# Patient Record
Sex: Female | Born: 1955 | Race: Black or African American | Hispanic: No
Health system: Southern US, Community
[De-identification: ages and names within clinical notes are randomized; demographics above are authoritative.]

## PROBLEM LIST (undated history)

## (undated) DIAGNOSIS — R42 Dizziness and giddiness: Secondary | ICD-10-CM

## (undated) DIAGNOSIS — I10 Essential (primary) hypertension: Secondary | ICD-10-CM

## (undated) DIAGNOSIS — E119 Type 2 diabetes mellitus without complications: Secondary | ICD-10-CM

## (undated) HISTORY — PX: TONSILLECTOMY: SHX5217

## (undated) HISTORY — PX: BREAST LUMPECTOMY: SHX2

## (undated) HISTORY — PX: BREAST CYST EXCISION: SHX579

---

## 2012-03-23 LAB — COMPREHENSIVE METABOLIC PANEL
Bilirubin,Total: 0.3 mg/dL (ref 0.2–1.0)
Co2: 26 mmol/L (ref 21–32)
EGFR (African American): >60
EGFR (Non-African Amer.): 60 — ABNORMAL LOW
Glucose: 167 mg/dL — ABNORMAL HIGH (ref 65–99)
Potassium: 3 mmol/L — ABNORMAL LOW (ref 3.5–5.1)
SGOT(AST): 17 U/L (ref 15–37)
SGPT (ALT): 20 U/L (ref 12–78)
Sodium: 139 mmol/L (ref 136–145)
Total Protein: 8.5 g/dL — ABNORMAL HIGH (ref 6.4–8.2)

## 2012-03-23 LAB — PRO B NATRIURETIC PEPTIDE: B-Type Natriuretic Peptide: 86 pg/mL (ref 0–125)

## 2012-03-23 LAB — URINALYSIS, COMPLETE
Bilirubin,UR: NEGATIVE
Ketone: NEGATIVE
Nitrite: NEGATIVE
Ph: 7 (ref 4.5–8.0)
Protein: 100
RBC,UR: 7 /HPF (ref 0–5)
Specific Gravity: 1.009 (ref 1.003–1.030)
Squamous Epithelial: 9
WBC UR: 7 /HPF (ref 0–5)

## 2012-03-23 LAB — CBC
HCT: 37.3 % (ref 35.0–47.0)
HGB: 11.8 g/dL — ABNORMAL LOW (ref 12.0–16.0)
Platelet: 463 10*3/uL — ABNORMAL HIGH (ref 150–440)

## 2012-03-23 LAB — CK TOTAL AND CKMB (NOT AT ARMC): CK, Total: 117 U/L (ref 21–215)

## 2012-03-23 LAB — TSH: Thyroid Stimulating Horm: 1.18 u[IU]/mL

## 2012-03-24 ENCOUNTER — Inpatient Hospital Stay: Payer: Self-pay | Admitting: Internal Medicine

## 2012-03-24 LAB — BASIC METABOLIC PANEL
Anion Gap: 6 — ABNORMAL LOW (ref 7–16)
BUN: 14 mg/dL (ref 7–18)
Calcium, Total: 9 mg/dL (ref 8.5–10.1)
Co2: 28 mmol/L (ref 21–32)
Creatinine: 0.91 mg/dL (ref 0.60–1.30)
EGFR (African American): >60
EGFR (Non-African Amer.): >60
Glucose: 145 mg/dL — ABNORMAL HIGH (ref 65–99)
Osmolality: 281 (ref 275–301)
Potassium: 4.3 mmol/L (ref 3.5–5.1)
Sodium: 139 mmol/L (ref 136–145)

## 2012-03-24 LAB — MAGNESIUM: Magnesium: 2.1 mg/dL

## 2012-03-24 LAB — LIPID PANEL
Cholesterol: 198 mg/dL (ref 0–200)
Ldl Cholesterol, Calc: 132 mg/dL — ABNORMAL HIGH (ref 0–100)

## 2012-03-24 LAB — CK TOTAL AND CKMB (NOT AT ARMC)
CK, Total: 110 U/L (ref 21–215)
CK, Total: 115 U/L (ref 21–215)
CK-MB: 0.8 ng/mL (ref 0.5–3.6)
CK-MB: 1.1 ng/mL (ref 0.5–3.6)

## 2012-03-24 LAB — HEMOGLOBIN A1C: Hemoglobin A1C: 6.2 % (ref 4.2–6.3)

## 2012-03-24 LAB — CBC WITH DIFFERENTIAL/PLATELET
Basophil %: 1 %
Eosinophil %: 0.3 %
HGB: 12.4 g/dL (ref 12.0–16.0)
Lymphocyte #: 2.8 10*3/uL (ref 1.0–3.6)
Lymphocyte %: 28.3 %
MCH: 27.3 pg (ref 26.0–34.0)
MCHC: 34.6 g/dL (ref 32.0–36.0)
MCV: 79 fL — ABNORMAL LOW (ref 80–100)
Neutrophil %: 66.1 %
Platelet: 430 10*3/uL (ref 150–440)
RBC: 4.56 10*6/uL (ref 3.80–5.20)
RDW: 16.3 % — ABNORMAL HIGH (ref 11.5–14.5)
WBC: 9.9 10*3/uL (ref 3.6–11.0)

## 2012-03-24 LAB — TROPONIN I: Troponin-I: 0.15 ng/mL — ABNORMAL HIGH

## 2012-03-25 DIAGNOSIS — R079 Chest pain, unspecified: Secondary | ICD-10-CM

## 2012-03-25 LAB — URINE CULTURE

## 2013-04-12 ENCOUNTER — Emergency Department: Payer: Self-pay | Admitting: Emergency Medicine

## 2013-04-12 LAB — BASIC METABOLIC PANEL
ANION GAP: 4 — AB (ref 7–16)
BUN: 18 mg/dL (ref 7–18)
CREATININE: 0.99 mg/dL (ref 0.60–1.30)
Calcium, Total: 8.8 mg/dL (ref 8.5–10.1)
Chloride: 103 mmol/L (ref 98–107)
Co2: 28 mmol/L (ref 21–32)
EGFR (Non-African Amer.): >60
GLUCOSE: 151 mg/dL — AB (ref 65–99)
OSMOLALITY: 275 (ref 275–301)
Potassium: 3.2 mmol/L — ABNORMAL LOW (ref 3.5–5.1)
SODIUM: 135 mmol/L — AB (ref 136–145)

## 2013-04-12 LAB — CBC WITH DIFFERENTIAL/PLATELET
BASOS ABS: 0.1 10*3/uL (ref 0.0–0.1)
Basophil %: 0.9 %
Eosinophil #: 0.4 10*3/uL (ref 0.0–0.7)
Eosinophil %: 4.6 %
HCT: 37 % (ref 35.0–47.0)
HGB: 11.8 g/dL — ABNORMAL LOW (ref 12.0–16.0)
LYMPHS ABS: 3.2 10*3/uL (ref 1.0–3.6)
Lymphocyte %: 36 %
MCH: 27.6 pg (ref 26.0–34.0)
MCHC: 32 g/dL (ref 32.0–36.0)
MCV: 86 fL (ref 80–100)
Monocyte #: 0.6 x10 3/mm (ref 0.2–0.9)
Monocyte %: 7.3 %
NEUTROS PCT: 51.2 %
Neutrophil #: 4.5 10*3/uL (ref 1.4–6.5)
Platelet: 334 10*3/uL (ref 150–440)
RBC: 4.29 10*6/uL (ref 3.80–5.20)
RDW: 14.8 % — AB (ref 11.5–14.5)
WBC: 8.7 10*3/uL (ref 3.6–11.0)

## 2013-04-12 LAB — TROPONIN I: Troponin-I: 0.03 ng/mL

## 2013-04-13 LAB — URINALYSIS, COMPLETE
BILIRUBIN, UR: NEGATIVE
BLOOD: NEGATIVE
GLUCOSE, UR: NEGATIVE mg/dL (ref 0–75)
Ketone: NEGATIVE
NITRITE: NEGATIVE
Ph: 5 (ref 4.5–8.0)
Protein: NEGATIVE
RBC,UR: 10 /HPF (ref 0–5)
SPECIFIC GRAVITY: 1.024 (ref 1.003–1.030)
Squamous Epithelial: 28
WBC UR: 27 /HPF (ref 0–5)

## 2013-09-15 ENCOUNTER — Encounter (HOSPITAL_COMMUNITY): Payer: Self-pay | Admitting: Emergency Medicine

## 2013-09-15 ENCOUNTER — Emergency Department (HOSPITAL_COMMUNITY)
Admission: EM | Admit: 2013-09-15 | Discharge: 2013-09-15 | Disposition: A | Discharge status: Home / Self Care | Payer: Self-pay | Attending: Emergency Medicine | Admitting: Emergency Medicine

## 2013-09-15 DIAGNOSIS — I10 Essential (primary) hypertension: Secondary | ICD-10-CM | POA: Insufficient documentation

## 2013-09-15 DIAGNOSIS — R42 Dizziness and giddiness: Secondary | ICD-10-CM | POA: Insufficient documentation

## 2013-09-15 DIAGNOSIS — H9190 Unspecified hearing loss, unspecified ear: Secondary | ICD-10-CM | POA: Insufficient documentation

## 2013-09-15 DIAGNOSIS — E119 Type 2 diabetes mellitus without complications: Secondary | ICD-10-CM | POA: Insufficient documentation

## 2013-09-15 DIAGNOSIS — Z79899 Other long term (current) drug therapy: Secondary | ICD-10-CM | POA: Insufficient documentation

## 2013-09-15 DIAGNOSIS — R110 Nausea: Secondary | ICD-10-CM | POA: Insufficient documentation

## 2013-09-15 DIAGNOSIS — Z7982 Long term (current) use of aspirin: Secondary | ICD-10-CM | POA: Insufficient documentation

## 2013-09-15 HISTORY — DX: Essential (primary) hypertension: I10

## 2013-09-15 HISTORY — DX: Type 2 diabetes mellitus without complications: E11.9

## 2013-09-15 HISTORY — DX: Dizziness and giddiness: R42

## 2013-09-15 LAB — BASIC METABOLIC PANEL
Anion gap: 16 — ABNORMAL HIGH (ref 5–15)
BUN: 19 mg/dL (ref 6–23)
CALCIUM: 9.5 mg/dL (ref 8.4–10.5)
CO2: 25 mEq/L (ref 19–32)
Chloride: 98 mEq/L (ref 96–112)
Creatinine, Ser: 0.91 mg/dL (ref 0.50–1.10)
GFR calc Af Amer: 80 mL/min — ABNORMAL LOW (ref >90)
GFR calc non Af Amer: 69 mL/min — ABNORMAL LOW (ref >90)
GLUCOSE: 166 mg/dL — AB (ref 70–99)
POTASSIUM: 3.1 meq/L — AB (ref 3.7–5.3)
Sodium: 139 mEq/L (ref 137–147)

## 2013-09-15 LAB — CBC WITH DIFFERENTIAL/PLATELET
Basophils Absolute: 0 10*3/uL (ref 0.0–0.1)
Basophils Relative: 0 % (ref 0–1)
Eosinophils Absolute: 0.1 10*3/uL (ref 0.0–0.7)
Eosinophils Relative: 1 % (ref 0–5)
HEMATOCRIT: 39.7 % (ref 36.0–46.0)
HEMOGLOBIN: 12.9 g/dL (ref 12.0–15.0)
Lymphocytes Relative: 19 % (ref 12–46)
Lymphs Abs: 1.9 10*3/uL (ref 0.7–4.0)
MCH: 28.2 pg (ref 26.0–34.0)
MCHC: 32.5 g/dL (ref 30.0–36.0)
MCV: 86.9 fL (ref 78.0–100.0)
MONO ABS: 0.4 10*3/uL (ref 0.1–1.0)
MONOS PCT: 4 % (ref 3–12)
NEUTROS ABS: 7.5 10*3/uL (ref 1.7–7.7)
NEUTROS PCT: 76 % (ref 43–77)
Platelets: 403 10*3/uL — ABNORMAL HIGH (ref 150–400)
RBC: 4.57 MIL/uL (ref 3.87–5.11)
RDW: 14.1 % (ref 11.5–15.5)
WBC: 9.9 10*3/uL (ref 4.0–10.5)

## 2013-09-15 LAB — URINALYSIS, ROUTINE W REFLEX MICROSCOPIC
Bilirubin Urine: NEGATIVE
GLUCOSE, UA: NEGATIVE mg/dL
KETONES UR: 15 mg/dL — AB
Nitrite: POSITIVE — AB
Protein, ur: NEGATIVE mg/dL
Specific Gravity, Urine: 1.018 (ref 1.005–1.030)
UROBILINOGEN UA: 0.2 mg/dL (ref 0.0–1.0)
pH: 6.5 (ref 5.0–8.0)

## 2013-09-15 LAB — URINE MICROSCOPIC-ADD ON

## 2013-09-15 LAB — CBG MONITORING, ED: GLUCOSE-CAPILLARY: 165 mg/dL — AB (ref 70–99)

## 2013-09-15 MED ORDER — POTASSIUM CHLORIDE CRYS ER 20 MEQ PO TBCR: 20.0000 meq | EXTENDED_RELEASE_TABLET | Freq: Every day | ORAL | Status: AC

## 2013-09-15 MED ORDER — POTASSIUM CHLORIDE CRYS ER 20 MEQ PO TBCR
40.0000 meq | EXTENDED_RELEASE_TABLET | Freq: Once | ORAL | Status: AC
  Administered 2013-09-15: 40 meq via ORAL
  Filled 2013-09-15: qty 2

## 2013-09-15 NOTE — Discharge Instructions (Signed)
Benign Positional Vertigo Your blood potassium today was 3.1, slightly low. Your blood sugar today was 166, slightly high. Keep your scheduled appointment with the [facility] [facility] next week. Asked him to recheck your blood potassium. Take these instructions with you. Take the meclizine (antivert) as needed for dizziness Vertigo means you feel like you or your surroundings are moving when they are not. Benign positional vertigo is the most common form of vertigo. Benign means that the cause of your condition is not serious. Benign positional vertigo is more common in older adults. CAUSES  Benign positional vertigo is the result of an upset in the labyrinth system. This is an area in the middle ear that helps control your balance. This may be caused by a viral infection, head injury, or repetitive motion. However, often no specific cause is found. SYMPTOMS  Symptoms of benign positional vertigo occur when you move your head or eyes in different directions. Some of the symptoms may include:  Loss of balance and falls.  Vomiting.  Blurred vision.  Dizziness.  Nausea.  Involuntary eye movements (nystagmus). DIAGNOSIS  Benign positional vertigo is usually diagnosed by physical exam. If the specific cause of your benign positional vertigo is unknown, your caregiver may perform imaging tests, such as magnetic resonance imaging (MRI) or computed tomography (CT). TREATMENT  Your caregiver may recommend movements or procedures to correct the benign positional vertigo. Medicines such as meclizine, benzodiazepines, and medicines for nausea may be used to treat your symptoms. In rare cases, if your symptoms are caused by certain conditions that affect the inner ear, you may need surgery. HOME CARE INSTRUCTIONS   Follow your caregiver's instructions.  Move slowly. Do not make sudden body or head movements.  Avoid driving.  Avoid operating heavy machinery.  Avoid performing any tasks that  would be dangerous to you or others during a vertigo episode.  Drink enough fluids to keep your urine clear or pale yellow. SEEK IMMEDIATE MEDICAL CARE IF:   You develop problems with walking, weakness, numbness, or using your arms, hands, or legs.  You have difficulty speaking.  You develop severe headaches.  Your nausea or vomiting continues or gets worse.  You develop visual changes.  Your family or friends notice any behavioral changes.  Your condition gets worse.  You have a fever.  You develop a stiff neck or sensitivity to light. MAKE SURE YOU:   Understand these instructions.  Will watch your condition.  Will get help right away if you are not doing well or get worse. Document Released: 10/27/2005 Document Revised: 04/13/2011 Document Reviewed: 10/09/2010 [facility] Patient Information 2015 [place], [place]. This information is not intended to replace advice given to you by your health care provider. Make sure you discuss any questions you have with your health care provider.

## 2013-09-15 NOTE — ED Notes (Addendum)
[facility] arrives alert, NAD, calm, interactive, resps e/u, speaking in clear complete sentences,  No dyspnea noted, skin W&D. NSL in place L hand. C/o "vertigo" dizziness and nausea. H/o vertigo, last episode in March. Took meclizine PTA w/o benefit. "feels much better now than before", (Denies: pain, HA, visual changes, nausea, sob or other sx), sx onset ~1800, reports "new recent dx of DM no meds, does not check sugars, diet controlled and education for now", previous care at [facility], last ate "this morning".

## 2013-09-15 NOTE — ED Provider Notes (Signed)
CSN: [id number]     Arrival date & time 09/15/13  2056 History   None    Chief Complaint  Patient presents with  . Dizziness     (Consider location/radiation/quality/duration/timing/severity/associated sxs/prior Treatment) HPI ComPlains of dizziness typical of vertigo onset 8 PM tonight while at work symptoms worse with changing positions or moving her head improved with remaining still. Associated symptoms include nausea. She denies visual changes denies difficulty speaking no focal numbness or weakness. Treated herself with meclizine with complete relief as of now. She is presently asymptomatic Past Medical History  Diagnosis Date  . Vertigo   . Diabetes mellitus without complication   . Hypertension    Past Surgical History  Procedure Laterality Date  . Breast lumpectomy Left     "cyst"   No family history on file. History  Substance Use Topics  . Smoking status: Never Smoker   . Smokeless tobacco: Not on file  . Alcohol Use: No   no illicit drug use OB History   Grav Para Term Preterm Abortions TAB SAB Ect Mult Living                 Review of Systems  HENT: Positive for hearing loss.        Chronic hearing loss right ear  Gastrointestinal: Positive for nausea.  Neurological: Positive for dizziness.      Allergies  Review of patient's allergies indicates no known allergies.  Home Medications   Prior to Admission medications   Medication Sig Start Date End Date Taking? Authorizing Provider  aspirin 325 MG tablet Take 325 mg by mouth daily.   Yes Historical Provider, MD  CARVEDILOL PO Take 1 tablet by mouth 2 (two) times daily.    Yes Historical Provider, MD  hydrochlorothiazide (HYDRODIURIL) 25 MG tablet Take 25 mg by mouth daily.   Yes Historical Provider, MD  losartan (COZAAR) 25 MG tablet Take 25 mg by mouth daily.   Yes Historical Provider, MD  meclizine (ANTIVERT) 25 MG tablet Take 25 mg by mouth 3 (three) times daily as needed for dizziness.   Yes  Historical Provider, MD   BP 125/56  Pulse 79  Temp(Src) 97.4 F (36.3 C) (Oral)  Resp 19  Ht 5\' 3"  (1.6 m)  Wt 242 lb (109.77 kg)  BMI 42.88 kg/m2  SpO2 97% Physical Exam  Nursing note and vitals reviewed. Constitutional: She is oriented to person, place, and time. She appears well-developed and well-nourished.  HENT:  Head: Normocephalic and atraumatic.  Bilateral tympanic membranes normal  Eyes: Conjunctivae are normal. Pupils are equal, round, and reactive to light.  Neck: Neck supple. No tracheal deviation present. No thyromegaly present.  Cardiovascular: Normal rate and regular rhythm.   No murmur heard. Pulmonary/Chest: Effort normal and breath sounds normal.  Abdominal: Soft. Bowel sounds are normal. She exhibits no distension. There is no tenderness.  Obese  Musculoskeletal: Normal range of motion. She exhibits no edema and no tenderness.  Neurological: She is alert and oriented to person, place, and time. She has normal reflexes. No cranial nerve deficit. Coordination normal.  DTRs symmetric bilaterally at knee jerk and third and biceps toes downward going bilaterally gait normal Romberg normal prior drift normal  Skin: Skin is warm and dry. No rash noted.  Psychiatric: She has a normal mood and affect.    ED Course  Procedures (including critical care time) Labs Review Labs Reviewed  BASIC METABOLIC PANEL - Abnormal; Notable for the following:    Potassium  3.1 (*)    Glucose, Bld 166 (*)    GFR calc non Af Amer 69 (*)    GFR calc Af Amer 80 (*)    Anion gap 16 (*)    All other components within normal limits  CBG MONITORING, ED - Abnormal; Notable for the following:    Glucose-Capillary 165 (*)    All other components within normal limits  CBC WITH DIFFERENTIAL  URINALYSIS, ROUTINE W REFLEX MICROSCOPIC    Imaging Review No results found.   EKG Interpretation   Date/Time:  Friday September 15 2013 20:59:17 EDT Ventricular Rate:  76 PR Interval:   184 QRS Duration: 108 QT Interval:  420 QTC Calculation: 472 R Axis:   25 Text Interpretation:  Sinus rhythm RSR' in V1 or V2, probably normal  variant Borderline T abnormalities, anterior leads No old tracing to  compare Confirmed by Ethelda ChickJACUBOWITZ  MD, Rickesha Veracruz [phone] on 09/15/2013 9:24:10 PM     Results for orders placed during the hospital encounter of 09/15/13  BASIC METABOLIC PANEL      Result Value Ref Range   Sodium 139  137 - 147 mEq/L   Potassium 3.1 (*) 3.7 - 5.3 mEq/L   Chloride 98  96 - 112 mEq/L   CO2 25  19 - 32 mEq/L   Glucose, Bld 166 (*) 70 - 99 mg/dL   BUN 19  6 - 23 mg/dL   Creatinine, Ser 6.040.91  0.50 - 1.10 mg/dL   Calcium 9.5  8.4 - 54.010.5 mg/dL   GFR calc non Af Amer 69 (*) >90 mL/min   GFR calc Af Amer 80 (*) >90 mL/min   Anion gap 16 (*) 5 - 15  URINALYSIS, ROUTINE W REFLEX MICROSCOPIC      Result Value Ref Range   Color, Urine YELLOW  YELLOW   APPearance CLOUDY (*) CLEAR   Specific Gravity, Urine 1.018  1.005 - 1.030   pH 6.5  5.0 - 8.0   Glucose, UA NEGATIVE  NEGATIVE mg/dL   Hgb urine dipstick MODERATE (*) NEGATIVE   Bilirubin Urine NEGATIVE  NEGATIVE   Ketones, ur 15 (*) NEGATIVE mg/dL   Protein, ur NEGATIVE  NEGATIVE mg/dL   Urobilinogen, UA 0.2  0.0 - 1.0 mg/dL   Nitrite POSITIVE (*) NEGATIVE   Leukocytes, UA SMALL (*) NEGATIVE  URINE MICROSCOPIC-ADD ON      Result Value Ref Range   Squamous Epithelial / LPF FEW (*) RARE   WBC, UA 0-2  <3 WBC/hpf   RBC / HPF 0-2  <3 RBC/hpf   Bacteria, UA MANY (*) RARE   Casts HYALINE CASTS (*) NEGATIVE  CBG MONITORING, ED      Result Value Ref Range   Glucose-Capillary 165 (*) 70 - 99 mg/dL   No results found.  MDM  Plan patient has meclizine at home. Prescription K. Dur. She has an appointment with her primary care physician on 09/19/2013. Suggest potassium recheck. Reports that her hemoglobin A1c's are followed by her primary care physician. She's currently not treated with antidiabetic medication. Vertigo is  felt to be peripheral in etiology Final diagnoses:  None   diagnosis #1 vertigo #2 hypokalemia #3 hyperglycemia      Doug SouSam Riaz Onorato, MD 09/15/13 2251

## 2013-09-15 NOTE — ED Notes (Signed)
Pt on BSC

## 2013-11-23 LAB — CBC AND DIFFERENTIAL
Neutrophils Absolute: 3 /uL
WBC: 8.3 10*3/mL

## 2013-11-23 LAB — BASIC METABOLIC PANEL
BUN: 18 mg/dL (ref 4–21)
Creatinine: 1.2 mg/dL — AB (ref 0.5–1.1)
GLUCOSE: 96 mg/dL
Sodium: 138 mmol/L (ref 137–147)

## 2013-11-23 LAB — LIPID PANEL
Cholesterol: 162 mg/dL (ref 0–200)
HDL: 47 mg/dL (ref 35–70)
LDL CALC: 89 mg/dL
Triglycerides: 130 mg/dL (ref 40–160)

## 2013-11-23 LAB — HEPATIC FUNCTION PANEL
ALK PHOS: 82 U/L (ref 25–125)
ALT: 12 U/L (ref 7–35)
AST: 14 U/L (ref 13–35)
BILIRUBIN, TOTAL: 0.3 mg/dL

## 2013-11-23 LAB — TSH: TSH: 1.29 u[IU]/mL (ref 0.41–5.90)

## 2013-11-30 DIAGNOSIS — E119 Type 2 diabetes mellitus without complications: Secondary | ICD-10-CM | POA: Insufficient documentation

## 2013-11-30 DIAGNOSIS — I10 Essential (primary) hypertension: Secondary | ICD-10-CM | POA: Insufficient documentation

## 2013-12-11 ENCOUNTER — Observation Stay: Payer: Self-pay | Admitting: Internal Medicine

## 2013-12-11 LAB — BASIC METABOLIC PANEL
Anion Gap: 12 (ref 7–16)
BUN: 25 mg/dL — ABNORMAL HIGH (ref 7–18)
Calcium, Total: 8.6 mg/dL (ref 8.5–10.1)
Chloride: 101 mmol/L (ref 98–107)
Co2: 26 mmol/L (ref 21–32)
Creatinine: 1.14 mg/dL (ref 0.60–1.30)
EGFR (Non-African Amer.): 52 — ABNORMAL LOW
Glucose: 197 mg/dL — ABNORMAL HIGH (ref 65–99)
OSMOLALITY: 287 (ref 275–301)
Potassium: 3.1 mmol/L — ABNORMAL LOW (ref 3.5–5.1)
SODIUM: 139 mmol/L (ref 136–145)

## 2013-12-11 LAB — CBC
HCT: 37 %
HGB: 12 g/dL
MCH: 27.7 pg
MCHC: 32.5 g/dL
MCV: 85 fL
Platelet: 386 x10 3/mm 3
RBC: 4.33 X10 6/mm 3
RDW: 14.7 % — ABNORMAL HIGH
WBC: 10.5 x10 3/mm 3

## 2013-12-11 LAB — TROPONIN I
TROPONIN-I: 0.05 ng/mL
TROPONIN-I: 0.06 ng/mL — AB
Troponin-I: 0.04 ng/mL
Troponin-I: 0.06 ng/mL — ABNORMAL HIGH
Troponin-I: 0.06 ng/mL — ABNORMAL HIGH

## 2013-12-11 LAB — CK-MB: CK-MB: 3.2 ng/mL (ref 0.5–3.6)

## 2013-12-12 LAB — CBC WITH DIFFERENTIAL/PLATELET
Basophil #: 0.1 10*3/uL (ref 0.0–0.1)
Basophil %: 1.1 %
EOS ABS: 0.3 10*3/uL (ref 0.0–0.7)
Eosinophil %: 2.8 %
HCT: 32.3 % — AB (ref 35.0–47.0)
HGB: 10.4 g/dL — ABNORMAL LOW (ref 12.0–16.0)
LYMPHS ABS: 3.8 10*3/uL — AB (ref 1.0–3.6)
LYMPHS PCT: 40.6 %
MCH: 27.9 pg (ref 26.0–34.0)
MCHC: 32.3 g/dL (ref 32.0–36.0)
MCV: 86 fL (ref 80–100)
Monocyte #: 0.5 x10 3/mm (ref 0.2–0.9)
Monocyte %: 5.8 %
NEUTROS PCT: 49.7 %
Neutrophil #: 4.7 10*3/uL (ref 1.4–6.5)
Platelet: 342 10*3/uL (ref 150–440)
RBC: 3.74 10*6/uL — AB (ref 3.80–5.20)
RDW: 14.8 % — AB (ref 11.5–14.5)
WBC: 9.4 10*3/uL (ref 3.6–11.0)

## 2013-12-12 LAB — BASIC METABOLIC PANEL
ANION GAP: 4 — AB (ref 7–16)
BUN: 25 mg/dL — AB (ref 7–18)
CALCIUM: 8.5 mg/dL (ref 8.5–10.1)
CHLORIDE: 104 mmol/L (ref 98–107)
CO2: 32 mmol/L (ref 21–32)
Creatinine: 1.22 mg/dL (ref 0.60–1.30)
EGFR (African American): 59 — ABNORMAL LOW
GFR CALC NON AF AMER: 48 — AB
Glucose: 126 mg/dL — ABNORMAL HIGH (ref 65–99)
Osmolality: 285 (ref 275–301)
Potassium: 3.8 mmol/L (ref 3.5–5.1)
Sodium: 140 mmol/L (ref 136–145)

## 2013-12-12 LAB — TROPONIN I: Troponin-I: 0.06 ng/mL — ABNORMAL HIGH

## 2013-12-12 LAB — LIPID PANEL
Cholesterol: 128 mg/dL (ref 0–200)
HDL Cholesterol: 37 mg/dL — ABNORMAL LOW (ref 40–60)
Ldl Cholesterol, Calc: 67 mg/dL (ref 0–100)
TRIGLYCERIDES: 120 mg/dL (ref 0–200)
VLDL Cholesterol, Calc: 24 mg/dL (ref 5–40)

## 2013-12-12 LAB — HEMOGLOBIN A1C: HEMOGLOBIN A1C: 6.5 % — AB (ref 4.2–6.3)

## 2013-12-12 LAB — TSH: Thyroid Stimulating Horm: 0.533 u[IU]/mL

## 2014-01-20 ENCOUNTER — Observation Stay: Payer: Self-pay | Admitting: Internal Medicine

## 2014-01-20 LAB — BASIC METABOLIC PANEL
Anion Gap: 7 (ref 7–16)
BUN: 19 mg/dL — AB (ref 7–18)
CO2: 31 mmol/L (ref 21–32)
Calcium, Total: 9.5 mg/dL (ref 8.5–10.1)
Chloride: 101 mmol/L (ref 98–107)
Creatinine: 1.08 mg/dL (ref 0.60–1.30)
EGFR (African American): >60
GFR CALC NON AF AMER: 56 — AB
Glucose: 127 mg/dL — ABNORMAL HIGH (ref 65–99)
Osmolality: 281 (ref 275–301)
POTASSIUM: 3.3 mmol/L — AB (ref 3.5–5.1)
Sodium: 139 mmol/L (ref 136–145)

## 2014-01-20 LAB — CBC
HCT: 38.8 % (ref 35.0–47.0)
HGB: 12.5 g/dL (ref 12.0–16.0)
MCH: 27.6 pg (ref 26.0–34.0)
MCHC: 32.1 g/dL (ref 32.0–36.0)
MCV: 86 fL (ref 80–100)
PLATELETS: 373 10*3/uL (ref 150–440)
RBC: 4.51 10*6/uL (ref 3.80–5.20)
RDW: 14.9 % — AB (ref 11.5–14.5)
WBC: 9.2 10*3/uL (ref 3.6–11.0)

## 2014-01-20 LAB — HEMOGLOBIN A1C: HEMOGLOBIN A1C: 6.4 % — AB (ref 4.2–6.3)

## 2014-01-20 LAB — LIPID PANEL
CHOLESTEROL: 152 mg/dL (ref 0–200)
HDL: 53 mg/dL (ref 40–60)
LDL CHOLESTEROL, CALC: 78 mg/dL (ref 0–100)
TRIGLYCERIDES: 104 mg/dL (ref 0–200)
VLDL CHOLESTEROL, CALC: 21 mg/dL (ref 5–40)

## 2014-01-21 LAB — CBC WITH DIFFERENTIAL/PLATELET
Basophil #: 0.1 10*3/uL (ref 0.0–0.1)
Basophil %: 1.1 %
Eosinophil #: 0.3 10*3/uL (ref 0.0–0.7)
Eosinophil %: 3.7 %
HCT: 35.1 % (ref 35.0–47.0)
HGB: 11.1 g/dL — ABNORMAL LOW (ref 12.0–16.0)
Lymphocyte #: 4 10*3/uL — ABNORMAL HIGH (ref 1.0–3.6)
Lymphocyte %: 45.9 %
MCH: 28 pg (ref 26.0–34.0)
MCHC: 31.8 g/dL — ABNORMAL LOW (ref 32.0–36.0)
MCV: 88 fL (ref 80–100)
Monocyte #: 0.4 x10 3/mm (ref 0.2–0.9)
Monocyte %: 5.2 %
Neutrophil #: 3.8 10*3/uL (ref 1.4–6.5)
Neutrophil %: 44.1 %
Platelet: 210 10*3/uL (ref 150–440)
RBC: 3.98 10*6/uL (ref 3.80–5.20)
RDW: 15.2 % — ABNORMAL HIGH (ref 11.5–14.5)
WBC: 8.6 10*3/uL (ref 3.6–11.0)

## 2014-01-21 LAB — BASIC METABOLIC PANEL
Anion Gap: 8 (ref 7–16)
BUN: 14 mg/dL (ref 7–18)
Calcium, Total: 8.3 mg/dL — ABNORMAL LOW (ref 8.5–10.1)
Chloride: 108 mmol/L — ABNORMAL HIGH (ref 98–107)
Co2: 25 mmol/L (ref 21–32)
Creatinine: 0.94 mg/dL (ref 0.60–1.30)
EGFR (African American): >60
EGFR (Non-African Amer.): >60
Glucose: 98 mg/dL (ref 65–99)
Osmolality: 282 (ref 275–301)
Potassium: 4.1 mmol/L (ref 3.5–5.1)
Sodium: 141 mmol/L (ref 136–145)

## 2014-01-21 LAB — TSH: Thyroid Stimulating Horm: 1.14 u[IU]/mL

## 2014-05-25 NOTE — Consult Note (Signed)
PATIENT NAME:  Gabriela Ramos, Gabriela Ramos MR#:  [id number] DATE OF BIRTH:  1955/09/21  DATE OF CONSULTATION:  03/24/2012  REFERRING PHYSICIAN:  Ramonita LabAruna Gouru, MD  CONSULTING PHYSICIAN:  Lamar BlinksBruce Ramos. Gautham Hewins, MD  REASON FOR CONSULTATION: Malignant hypertension, elevated troponin.   CHIEF COMPLAINT: "I have chest pain."   HISTORY OF PRESENT ILLNESS: This is a 59 year old female with no history of cardiovascular disease having weakness, fatigue and irritability with some dizziness associated with concerns of malignant hypertension with systolic pressure in the 200 range. The patient was seen with this and was given labetalol and Norvasc which has improved her blood pressure into the 180 systolic range. EKG showed normal sinus rhythm, normal EKG. Troponin 0.01 consistent with demand ischemia. She feels well at this time and may need further treatment.   REVIEW OF SYSTEMS: The remainder is negative for vision change, ringing in the ears, hearing loss, cough, congestion, heartburn, nausea, vomiting, diarrhea, bloody stools, stomach pain, extremity pain, leg weakness, cramping of the buttocks, known blood clots, headaches, blackouts, dizzy spells, nose bleeds, congestion, trouble swallowing, frequent urination, urination at night, muscle weakness, numbness, anxiety, depression, skin lesions or skin rashes.   PAST MEDICAL HISTORY:  Hypertension.   FAMILY HISTORY: Brother had cardiovascular disease at early age.   SOCIAL HISTORY: Smokes 1/2 pack per day and denies alcohol use.   ALLERGIES: No known drug allergies.  CURRENT MEDICATIONS: As listed.   PHYSICAL EXAMINATION: VITAL SIGNS: Blood pressure 180/64 bilaterally, heart rate is 72 upright, reclining and regular.  GENERAL: She is a well appearing female in no acute distress.  HEENT: No icterus, thyromegaly, ulcers, hemorrhage or xanthelasma.  CARDIOVASCULAR: Regular rate and rhythm with normal S1 and S2 without murmur, gallop or rub. PMI is normal size and  placement. Carotid upstroke normal without bruit. Jugular venous pressure normal.  LUNGS: Clear to auscultation with normal respirations.  ABDOMEN: Soft and nontender without hepatosplenomegaly or masses. Abdominal aorta is normal size without bruit.  EXTREMITIES: 2+ bilateral pulses in dorsal, pedal, radial and femoral arteries without lower extremity edema, cyanosis, clubbing or ulcers.  NEUROLOGIC: She is oriented to time, place and person with normal mood and affect.   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   RECOMMENDATIONS: 1. Continue labetalol and/or other beta blocker as well as addition of chlorthalidone, angiotensin receptor blocker and calcium channel blocker for hypertension control with a goal systolic blood pressure in the 160 range.  2. No further cardiac diagnostics at this time.  3. Ambulate and follow for improvements of symptoms.  4. The patient would be able to discharge with further outpatient treatment and adjustments of medications. ____________________________ Lamar BlinksBruce Ramos. Khyren Hing, MD bjk:sb D: 03/24/2012 08:21:51 ET T: 03/24/2012 08:34:15 ET JOB#: [id number]  cc: Lamar BlinksBruce Ramos. Jazline Cumbee, MD, <Dictator> Lamar BlinksBRUCE Ramos Greysen Devino MD ELECTRONICALLY SIGNED 04/01/2012 8:54

## 2014-05-25 NOTE — H&P (Signed)
PATIENT NAME:  Gabriela Ramos, Gabriela Ramos MR#:  [id number] DATE OF BIRTH:  [birth year]  DATE OF ADMISSION:  03/23/2012  PRIMARY CARE PHYSICIAN: None.   REFERRING PHYSICIAN: Dr. Enedina FinnerGoli.   CHIEF COMPLAINT: Dizziness, weakness.   HISTORY OF PRESENT ILLNESS: The patient is a 59 year old African American female with no significant past medical history as she was not seen by any doctor for the past 30 years. She is presenting to the ER with a chief complaint of dizziness and not feeling right. When the patient came into the ER, her initial blood pressure was 259/122 with a pulse of 105, but the patient was satting 96% on room air and denying any chest pain. Stat CAT scan of the head was done as the patient was complaining of dizziness which was with no acute findings. The patient has received labetalol 20 mg IV, and subsequently, blood pressure dropped down to 175/92 and 185/92. The patient's initial troponin was elevated at 0.10. A 12-lead EKG has revealed no ST-T elevations. Potassium was low at 3.0, and magnesium was low at 1.7. Hospitalist team is called to admit the patient regarding elevated blood pressure and elevated troponin. During my examination, the patient denies any chest pain, shortness of breath, abdominal pain, nausea, vomiting or diarrhea. Denies any headache, blurry vision, double vision or black spots. No similar complaints in the past. Never seen by any cardiologist. Never had any stress test done. Her 2 daughters were at bedside. No other complaints.   PAST MEDICAL HISTORY: None.   PAST SURGICAL HISTORY: None.   ALLERGIES: No known drug allergies.   MEDICATIONS: Not on any home medications.   PSYCHOSOCIAL HISTORY: Lives at home. Smokes 2 to 3 cigarettes per day. Denies alcohol or illicit drug usage.   FAMILY HISTORY: Mother had a history of hypertension, diabetes mellitus. Dad deceased with massive heart attack.    REVIEW OF SYSTEMS:  CONSTITUTIONAL: Denies any fever or fatigue but  complaining of weakness.  EYES: Denies any blurry vision or cataracts.  EARS, NOSE, THROAT: No ear pain, discharge, snoring, postnasal drip.  RESPIRATORY: Denies cough, wheezing or shortness of breath.  CARDIOVASCULAR: Denies any chest pain or palpitations but complaining of dizziness.  GASTROINTESTINAL: No nausea, vomiting, diarrhea. No abdominal pain.  GENITOURINARY: Denies dysuria or hematuria.  GYNECOLOGIC AND BREASTS: No breast mass. No vaginal discharge.  ENDOCRINE: Denies any polyuria, nocturia, thyroid problems.  HEMATOLOGIC AND LYMPHATIC: No anemia or easy bruising.  INTEGUMENTARY: No acne, rash, lesions.  MUSCULOSKELETAL: No neck pain, back pain, shoulder pain. No history of gout.  NEUROLOGIC: Denies any CVA, TIA, vertigo, dementia.  PSYCHIATRIC: Denies any anxiety, insomnia, ADD, OCD.   PHYSICAL EXAMINATION:  VITAL SIGNS: Temp is 98.6, pulse 74, respirations 20, blood pressure 161/78, pulse ox 96% on room air.  GENERAL APPEARANCE: Not in acute distress. Moderately built and obese.   HEAD, EYES, EARS, NOSE, THROAT: Normocephalic, atraumatic. Pupils are equally reacting to light and accommodation. No conjunctival injection. Nares are patent. No sinus tenderness.  NECK: Supple. No JVD. No thyromegaly.  LUNGS: Clear to auscultation bilaterally. No accessory muscle usage. No anterior chest wall tenderness.  CARDIOVASCULAR: S1, S2 normal. Regular rate and rhythm. No murmurs.  GASTROINTESTINAL: Soft. Bowel sounds are positive in all 4 quadrants. Nontender, nondistended. No masses felt.  NEUROLOGIC: Awake, alert, oriented x3. Cranial nerves II through XII are grossly intact. Motor and sensory are intact.  EXTREMITIES: No edema. No cyanosis. No clubbing.  SKIN: With no rashes, lesions. Normal turgor. Warm to touch.  MUSCULOSKELETAL: No joint effusion, tenderness. No erythema.   LABS AND IMAGING STUDIES: CAT scan of the head: No acute intracranial abnormality . Chest x-ray: No acute  infiltrates. A 12-lead EKG: Sinus tachycardia at 98 beats per minute, normal PR and QRS intervals, positive left ventricular hypertrophy, nonspecific ST-T wave changes. Glucose 167, BNP 86, BUN 16, creatinine 1.05, sodium 139, potassium 3.0, chloride 103, CO2 26, GFR greater than 60. Anion gap is 10. Osmolality 283. Calcium 8.9. Magnesium is low at 1.7. CK total 117. Troponin 0.10. Thyroid stimulating hormone 1.18. WBC 8.4, hemoglobin 11.8,  platelets 463, MCV 79. Urinalysis hazy in appearance, glucose 50 mg/dL, ketones negative,  pH 7.0, protein 100 mg/dL, nitrite negative, leukocyte esterase 2+.   ASSESSMENT AND PLAN: A 59 year old female presenting to the ER with a chief complaint of dizziness and not feeling right and was not seen by any doctor for the past 30 years. Will be admitted with the following assessment and plan:  1. Hypertensive urgency: Will start her on 2 antihypertensives, beta blocker and amlodipine. Will obtain a renal ultrasound.  2. Elevated troponin, probably from malignant hypertension. Other differential can be non-ST elevation myocardial infarction (non-STEMI), but the patient is currently asymptomatic. Will cycle cardiac biomarkers. Will obtain 2-D echocardiogram and also renal ultrasound. Will put her on acute coronary syndrome (ACS) protocol. Cardiology consult is placed as the patient was not seen by any cardiologist in the past.   3. Hypokalemia: Will replete and recheck potassium in the a.m.  4. Hypomagnesemia: This might be causing hypokalemia. Will replace magnesium in the form of magnesium sulfate 1 g intravenous and recheck magnesium in the a.m.  5. Glucosuria: Will rule out diabetes mellitus. Will get hemoglobin A1c checked.  6. Will provide her gastrointestinal prophylaxis and deep vein thrombosis prophylaxis.   She is full code.   The diagnosis and plan of care was discussed in detail with the patient. I have notified Dr. Gwen Pounds regarding the consult. He is aware  and the patient will be seen by him in a.m.   TOTAL TIME SPENT ON ADMISSION: 55 minutes.   ____________________________ Ramonita Lab, MD ag:gb D: 03/24/2012 01:00:42 ET T: 03/24/2012 01:54:25 ET JOB#: [id number]  cc: Ramonita Lab, MD, <Dictator> Ramonita Lab MD ELECTRONICALLY SIGNED 03/24/2012 6:54

## 2014-05-25 NOTE — Discharge Summary (Signed)
PATIENT NAME:  Gabriela Ramos, Gabriela Ramos MR#:  [id number] DATE OF BIRTH:  Sep 14, 1955  DATE OF ADMISSION:  03/24/2012 DATE OF DISCHARGE:  03/26/2012  FINAL DIAGNOSES:  1. Malignant hypertension.  2. Elevated troponin.  3. Hypokalemia and hypomagnesemia.  4. Impaired fasting glucose.  5. Possible urinary tract infection.  6. Tobacco abuse.   MEDICATIONS ON DISCHARGE: Include losartan 50 mg daily, chlorthalidone 12.5 mg daily, ciprofloxacin 500 mg every 12 hours until completed, nicotine patch 7 mg per 24 hour transdermal film 1 patch daily.   DIET: Low sodium diet, regular consistency.   ACTIVITY: As tolerated.   FOLLOWUP: Within 2 to 4 weeks at the [facility].   CHIEF COMPLAINT: The patient was admitted with dizziness and weakness.   HISTORY OF PRESENT ILLNESS: This 59 year old female came in with dizziness and weakness. Found to have a blood pressure of 259/122, a pulse of 105. She was admitted with hypertensive urgency, an elevated troponin, hypokalemia and hypomagnesemia. She was seen in consultation by Dr. Gwen PoundsKowalski, cardiology, who did not recommend any further testing.   LABORATORY AND RADIOLOGICAL DATA DURING THE HOSPITAL COURSE: Included a urine culture that grew out mixed bacterial organisms, troponin 0.10, BNP 86, magnesium 1.7. Urinalysis: Leukocyte esterase 2+. Glucose 167, BUN 16, creatinine 1.05, sodium 139, potassium 3.0, chloride 103, CO2 26, calcium 8.9. Liver function tests: Total protein slightly elevated at 8.5. White blood cell count 8.4, H and H 11.8 and 37.3, platelet count of 463. EKG showed a normal sinus rhythm, LVH. Hemoglobin A1c 6.2. Chest x-ray: Mild enlargement of the cardiac silhouette. No evidence of CHF. CT scan of the head: No acute intracranial abnormality. Ultrasound of the kidneys: Normal appearing renal sonogram. Next troponin borderline at 0.15. Next troponin borderline at 0.19. LDL 132, HDL 49, triglycerides 86. Echocardiogram showed a low normal EF  estimated 50%, mild MR and TR.   HOSPITAL COURSE PER PROBLEM LIST:  1. For the patient's malignant hypertension, blood pressure was very variable during the hospitalization; as high as 185/124, then dipped down as low as 107/66, upon discharge 158/84. The patient was given losartan and chlorthalidone for that. Recommend checking a blood pressure within a week and a BMP in 1 week. I would rather have the patient's blood pressure on the higher side at this point and further titration in the next week or so.  2. Elevated troponin: Likely from the malignant hypertension. No evidence of myocardial infarction.  3. Hypokalemia and hypomagnesemia: This was replaced during the hospital course. The patient will eat a banana or an orange on a daily basis. Losartan should help keep electrolytes up.  4. Impaired fasting glucose: Hemoglobin A1c only 6.2. I cannot diagnose diabetes at this point, but I did discuss low-carbohydrate diet.  5. Possible UTI: The patient was on Rocephin. I switched over to Cipro upon discharge for a couple more days.  6. Tobacco abuse: Smoking cessation counseling done during the hospital course. Nicotine patch applied.   TIME SPENT ON DISCHARGE: 35 minutes.    ____________________________ Herschell Dimesichard Ramos. Renae GlossWieting, MD rjw:gb D: 03/26/2012 16:07:16 ET T: 03/27/2012 04:56:20 ET JOB#: [id number]  cc: Herschell Dimesichard Ramos. Renae GlossWieting, MD, <Dictator> [facility] Salley ScarletICHARD Ramos Derrel Moore MD ELECTRONICALLY SIGNED 03/31/2012 15:40

## 2014-05-26 NOTE — H&P (Signed)
PATIENT NAME:  Gabriela Ramos, Gabriela Ramos MR#:  [id number] DATE OF BIRTH:  March 18, 1955  DATE OF ADMISSION:  12/11/2013  REFERRING EMERGENCY ROOM PHYSICIAN: Juneau Sink. Dolores Frame, M.D.   PRIMARY CARE PHYSICIAN: IT trainer.   CHIEF COMPLAINT: Dizziness.   HISTORY OF PRESENT ILLNESS: This very pleasant, 59 year old woman, with past medical history of hypertension, presents with complaint of dizziness, which started abruptly at 10 p.m. last night. 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 She lied down and felt the room continue to spin. She vomited multiple times, does not remember any hematemesis. An hour or two later, when she could not stop vomiting and could not stand up, she called the EMS and presented to the Emergency Room for further evaluation. She does report that this has happened to her before on several occasions. She was admitted to [facility] in August of this year with the same symptoms. At that time, she was discharged with recommendation to follow up with ENT, presumably for benign positional vertigo. She has also been admitted to this facility in the past in February 2014 with similar symptoms attributed to uncontrolled hypertension. On presentation to the Emergency Room, she continues to vomit. Blood pressure is controlled. Laboratories are fairly unremarkable. Hospitalist services are asked to admit for further evaluation.   PAST MEDICAL HISTORY:  1. Hypertension.  2. Prediabetes.  3. Recurrent vertigo.  4. Hyperlipidemia.  PAST SURGICAL HISTORY: None.   ALLERGIES: No known drug allergies.   HOME MEDICATIONS: 1. Simvastatin 20 mg 1 tablet daily.  2. Meclizine 25 mg orally every 6 hours as needed for dizziness.  3. Losartan 50 mg 1 tablet daily.  4. Chlorthalidone 12.5 mg once a day.  5. Carvedilol 12.5 mg 1 tablet once a day.    SOCIAL HISTORY: The patient lives at home with her daughter and grandson.  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 She does not smoke cigarettes, use illicit substances or drink alcohol. She does not require any home health services or equipment.   FAMILY MEDICAL HISTORY: Positive for coronary artery disease in her mother, brother and father. No family members with stroke. No family members with colon cancer or breast cancer.   REVIEW OF SYSTEMS:  CONSTITUTIONAL: Negative for fever, fatigue or weight change.  HEENT: No change in vision or hearing. No pain in eyes or ears. No difficulty swallowing. No upper respiratory tract symptoms.  RESPIRATORY: No cough, wheezing, shortness of breath.  CARDIOVASCULAR: No palpitations or orthopnea, no edema or chest pain. No syncope.  GASTROINTESTINAL: Positive for nausea and vomiting. No abdominal pain or diarrhea. Unknown if there was hematemesis. She did not look. No history of gastroesophageal reflux disease.  GENITOURINARY: Negative for dysuria or frequency.  ENDOCRINE: Negative for known history of thyroid problems, polyuria or polydipsia.  MUSCULOSKELETAL: No new pain in the neck, shoulders knees, hips or other joints. No myalgias.  NEUROLOGIC: No focal numbness, weakness or confusion. No dysarthria or seizure. No memory loss. Positive for vertigo and headache.  PSYCHIATRIC: No uncontrolled anxiety or depression.   PHYSICAL EXAMINATION:  VITAL SIGNS: Temperature is 97.8, pulse 66, respirations 20, blood pressure /68, pulse oximetry 68% on room air.  GENERAL: The patient is uncomfortable and very still in the examination bed.  HEENT: Pupils are equal, round, and reactive to light. Conjunctivae are clear, extraocular motion is intact with bilateral nystagmus of 1 to 2  beats, mucous membranes are moist. Upper and lower dentures are present. Posterior oropharynx is clear with no exudate or edema.  NECK: Supple with no lymphadenopathy. Thyroid is nontender with no nodules noted.  RESPIRATORY: Lungs are clear to  auscultation bilaterally with good air movement, no respiratory distress.  CARDIOVASCULAR: Regular rate and rhythm. No murmurs, rubs or gallops. No peripheral edema. Peripheral pulses are 2+.  ABDOMEN: Soft, nontender, nondistended. Bowel sounds are normal. No guarding.  MUSCULOSKELETAL: Range of motion is normal in all joints. No warm, swollen or tender joints. Strength and tone are equal bilaterally.  SKIN: No rash or wounds noted, slightly cold and diaphoretic.  NEUROLOGIC: Cranial nerves II through XII are grossly intact. Strength and sensation are intact bilaterally. Nonfocal examination.  PSYCHIATRIC: The patient is alert and oriented x 4. Good insight. No signs of uncontrolled depression or anxiety.   LABORATORY DATA: Sodium 139, potassium 3.1, chloride 101, bicarbonate 26, BUN 25, creatinine 1.14, glucose 197. Troponins negative x 2. White blood cells 10.5, hemoglobin 12.0, platelets 386,000. MCV 85.   IMAGING: None.   ASSESSMENT AND PLAN: 1. Dizziness, most likely recurrence of benign positional vertigo: We will admit to the general-medical floor with telemetry to monitor for arrhythmia. We will provide meclizine. She just had a full work-[facility] the same symptoms [facility]. We will request those records to see if, at that time, additional testing was performed. This will avoid redundant testing. At this time, her blood pressure is well controlled and I will continue her home regimen with the exception of chlorthalidone as she is vomiting. I want to avoid excessive volume loss. Laboratories are unremarkable.  2. Hypokalemia: Replete and recheck in the morning.  3. Hypertension: Controlled at this time. Continue with Coreg and losartan, hold chlorthalidone as she is vomiting.  4. Nausea and vomiting: Provide Zofran for symptom relief.  5. Hyperglycemia: We will check a hemoglobin A1c and monitor blood sugars while in hospital.   TIME SPENT ON THIS ADMISSION: 40  minutes.   ____________________________ Ena Dawleyatherine P. Clent RidgesWalsh, MD cpw:JT D: 12/11/2013 08:25:14 ET T: 12/11/2013 09:37:02 ET JOB#: [id number]  cc: Santina Evansatherine P. Clent RidgesWalsh, MD, <Dictator> Gale JourneyATHERINE P WALSH MD ELECTRONICALLY SIGNED 12/15/2013 11:46

## 2014-05-26 NOTE — Discharge Summary (Signed)
PATIENT NAME:  Gabriela Ramos, Gabriela Ramos MR#:  [id number] DATE OF BIRTH:  Sep 29, 1955  DATE OF ADMISSION:  12/11/2013 DATE OF DISCHARGE:  12/12/2013  ADMITTING COMPLAINT:  Dizziness, nausea and vomiting.   DISCHARGE DIAGNOSES: 1.  Benign positional vertigo, recurrent.  2.  Hypokalemia, due to vomiting.  3.  Hypertension.  4.  Nausea and vomiting, due to vertigo.  5.  Hyperglycemia.   CONSULTATIONS: None.   PROCEDURES:  EKG 12/12/2013:  Normal sinus rhythm with no changes or unusual findings.   HISTORY OF PRESENT ILLNESS: This is a very pleasant 59 year old woman with past medical history of hypertension and recurrent benign positional vertigo, presents with complaint of dizziness which started abruptly the evening prior to presentation. She reports that she was coming home from work, was walking across the room, when she had acute onset dizziness and nausea. She vomited multiple times, could not stop vomiting, and found that she was too weak to stand up. At that time, she called emergency medical services and presents to the Emergency Room. At the time of presentation, she continues to vomit. Hospitalist services are asked to admit for further evaluation.  HOSPITAL COURSE BY PROBLEM:  1.  Benign positional vertigo, recurrent:  The patient states that her current symptoms are very similar to prior episodes of vertigo. She has been admitted at this hospital and prior to this, to [facility] in August of this year with the same symptoms. She was supposed to follow up with ENT and is planning to do so after discharge. At the time of discharge, her symptoms have resolved completely. She did not have any focal numbness, weakness or confusion. She had a full evaluation for this condition 3 months ago, so workup was not performed at this time.  2.  Hypertension: Blood pressure is controlled on home regimen of carvedilol and losartan.  Note that on discharge, Norvasc and hydrochlorothiazide were held  due to slightly low blood pressure. This was assumed to be due to vomiting. She will hold these medications until she sees her for primary care provider. Certainly, overtreatment of hypertension may contribute to symptoms of dizziness. Also, hydrochlorothiazide has been associated with vertigo. She will discuss this with her primary care.   3.  Hyperlipidemia: She continues on a statin.  4.  Hyperglycemia: Hemoglobin A1c was 6.5. She was notified of this and will need to follow up with her primary care physician for further evaluation and treatment. Lifestyle modification was recommended. 5.  Hypokalemia: Repleted. This was due to vomiting.  6.  Nausea and vomiting: Resolved. The patient is tolerating full diet at the time of discharge.   LABORATORY DATA: Sodium 140, potassium 3.8, chloride 104, bicarbonate 32, BUN 25, creatinine 1.12. Note that she had a mild increase in troponin from 0.05 to 0.06 which was repeated 4 times and remained stable. White blood cell count 9.4, hemoglobin 10.4, platelets 342,000, MCV 86.  hemoglobin A1c 6.5.   MEDICATIONS ON DISCHARGE:   1.  Simvastatin 20 mg 1 tablet daily.  2.  Meclizine 25 mg 1 tablet every 6  hours as needed for vertigo.  3.  Carvedilol 12.5 mg 1 tablet twice a day.  4.  Losartan 1 tablet once a day in the morning.  5. Aspirin 81 mg 1 tablet daily.  Note that she is to stop taking Norvasc and hydrochlorothiazide. The patient has been informed and understands she will not resume these medications until she sees her primary care physician.   CONDITION  ON DISCHARGE: Stable.   DISCHARGE DISPOSITION: Discharged to home with no home health needs.   DISCHARGE INSTRUCTIONS:  DIET: Low-fat, low-cholesterol, carbohydrate-controlled diet.   ACTIVITY LIMITATIONS:  None, exercise is encouraged.   TIMEFRAME FOR FOLLOWUP APPOINTMENT: Please follow up with your primary care physician by the end of this week for a blood pressure check and to discuss blood  pressure medications.   TIME SPENT ON DISCHARGE: 35 minutes.   ____________________________ Ena Dawleyatherine P. Clent RidgesWalsh, MD cpw:DT D: 12/14/2013 14:38:14 ET T: 12/14/2013 15:04:44 ET JOB#: [id number]  cc: Santina Evansatherine P. Clent RidgesWalsh, MD, <Dictator> Gale JourneyATHERINE P Morganna Styles MD ELECTRONICALLY SIGNED 12/15/2013 11:48

## 2014-05-26 NOTE — H&P (Signed)
PATIENT NAME:  Gabriela Ramos, Gabriela Ramos MR#:  [id number] DATE OF BIRTH:  1955/09/18  DATE OF ADMISSION:  01/20/2014  REFERRING PHYSICIAN: Chiquita Loth, MD   PRIMARY CARE PHYSICIAN: Nonlocal.   ADMISSION DIAGNOSIS: Intractable vomiting.   HISTORY OF PRESENT ILLNESS: This is a 59 year old African American female who presents to the Emergency Department with dizziness and vomiting. The patient has a known history of benign paroxysmal positional vertigo and experienced an episode similar to one for which she was admitted to the hospital last month. She was able to make it home driving her car, but needed to sit in the driveway for some time until she was able to move into the house due to dizziness. While at home she vomited more times than she can count. She does not recall seeing any blood or bilious material in her emesis. In the Emergency Department, the patient was evaluated and started to feel significantly better to the point at which she was actually discharged but began to vomit before she left the premises, which prompted the Emergency Department to call again for admission.   REVIEW OF SYSTEMS:  CONSTITUTIONAL: The patient denies fever or weakness.  EYES: Denies blurred vision or inflammation.  EARS, NOSE AND THROAT: Denies tinnitus or sore throat.  RESPIRATORY: Denies cough or shortness of breath.  CARDIOVASCULAR: Denies chest pain or palpitations.  GASTROINTESTINAL: Admits to nausea and vomiting but denies diarrhea or abdominal pain.  GENITOURINARY: Denies dysuria, increased frequency, or hesitancy of urination.  ENDOCRINE: Denies polyuria or polydipsia.  HEMATOLOGIC AND LYMPHATIC: Denies easy bruising or bleeding.  INTEGUMENT: Denies rashes or lesions.  MUSCULOSKELETAL: Denies arthralgias or myalgias.  NEUROLOGIC: Denies numbness in her extremities or dysarthria.  PSYCHIATRIC: Denies depression or suicidal ideation.   PAST MEDICAL HISTORY: Hypertension, benign paroxysmal positional vertigo,  metabolic syndrome as well as hyperlipidemia.   PAST SURGICAL HISTORY: None.   SOCIAL HISTORY: The patient denies smoking, drinking, or doing any drugs.   FAMILY HISTORY: Hypertension and diabetes.   MEDICATIONS:  1.  Antivert 25 mg 1 tablet p.o. every 4 hours as needed for dizziness.  2.  Aspirin 81 mg 1 tablet p.o. daily.  3.  Carvedilol 12.5 mg 1 tablet p.o. b.i.d.  4.  Losartan 50 mg 1 tablet p.o. q.a.m.  5.  Simvastatin 20 mg 1 tablet p.o. at bedtime.    ALLERGIES: No known drug allergies.   PERTINENT LABORATORY RESULTS AND RADIOGRAPHIC FINDINGS: Serum glucose is 127, BUN 19, creatinine 1.08, serum sodium 139, potassium 3.3, chloride 101, bicarbonate is 31, calcium 9.5. White blood cell count 9.2, hemoglobin 12.5, hematocrit 38.8, platelet count 373,000.   PHYSICAL EXAMINATION:  VITAL SIGNS: Temperature is 97.5, pulse 63, respirations 16, blood pressure 121/62, pulse oximetry is 96% on room air.  GENERAL: The patient is alert and oriented x 3 in no apparent distress.  HEENT: Normocephalic, atraumatic. Pupils equal, round, and reactive to light and accommodation. Extraocular movements are intact; the patient does have at least one beat of  horizontal nystagmus on turning to the left and right. Mucous membranes are mildly dry.  NECK: Trachea is midline. No adenopathy.  CHEST: Symmetric, atraumatic.  CARDIOVASCULAR: Regular rate and rhythm. Normal S1, S2. No rubs, clicks, or murmurs appreciated.  LUNGS: Clear to auscultation bilaterally. Normal effort and excursion.  ABDOMEN: Positive bowel sounds. Soft, nontender, nondistended. No hepatosplenomegaly.  GENITOURINARY: Deferred.  MUSCULOSKELETAL: The patient moves all 4 extremities equally. There is 5/5 strength in upper and lower extremities bilaterally.  SKIN: No rashes  or lesions.  EXTREMITIES: No clubbing or cyanosis. The patient does have some nonpitting edema of her ankles.  NEUROLOGIC: Cranial nerves II-XII are grossly intact.  There is no vertical nystagmus.  PSYCHIATRIC: Mood is normal. Affect is congruent.   ASSESSMENT AND PLAN: This is a 59 year old female admitted for intractable vomiting.  1.  Intractable vomiting. It has been nonbloody and nonbilious to her knowledge. This is likely secondary to benign paroxysmal positional vertigo. She has no vertical nystagmus and thus has no neurologic red flags. We will give emetics as necessary and hydrate the patient and continue to observe for neurologic improvement.  2.  Hypertension, controlled at this time. Continue carvedilol.  3.  Hyperlipidemia. Continue statin therapy.  4.  Metabolic syndrome. I will add metformin to her regimen. Her A1c last hospitalization was 6.5.  5.  Deep vein thrombosis prophylaxis. Sequential compression devices.  6.  Gastrointestinal prophylaxis. H2-blocker as needed.   CODE STATUS: The patient is a full code.   TIME SPENT ON ADMISSION ORDERS AND PATIENT CARE: Approximately 30 minutes.    ____________________________ Kelton PillarMichael S. Sheryle Hailiamond, MD msd:bm D: 01/20/2014 05:32:49 ET T: 01/20/2014 05:55:00 ET JOB#: [id number]  cc: Kelton PillarMichael S. Sheryle Hailiamond, MD, <Dictator> Kelton PillarMICHAEL S Kelyse Pask MD ELECTRONICALLY SIGNED 01/21/2014 0:42

## 2014-05-30 NOTE — Discharge Summary (Signed)
PATIENT NAME:  Gabriela Ramos, Lorisa J MR#:  [id number] DATE OF BIRTH:  [birth year]  DATE OF ADMISSION:  01/20/2014 DATE OF DISCHARGE:  01/21/2014  ADMITTING PHYSICIAN: Joycelyn RuaMichael Diamond, MD.   DISCHARING PHYSICIAN: Enid Baasadhika Graziella Connery, MD.  PRIMARY CARE PHYSICIAN: Nonlocal.   CONSULTATIONS IN THE HOSPITAL: None.   DISCHARGE DIAGNOSES: 1. Benign positional vertigo.  2. Hypertension.  3. Hypokalemia.  4. Early diabetes mellitus with metabolic syndrome.   DISCHARGE HOME MEDICATIONS:  1. Simvastatin 20 mg p.o. daily.  2. Aspirin 81 mg p.o. daily.  3. Zofran 4 mg oral disintegrating tablet every 4 to 6 hours p.r.n. for nausea and vomiting.  4. Losartan 50 mg p.o. daily.  5. Coreg 12.5 mg p.o. b.i.d.  6. Meclizine 25 mg p.o. every 8 p.r.n. for dizziness.  7. Metformin 500 mg p.o. daily.   DISCHARGE DIET: Low-sodium, low-fat, carbohydrate-controlled diet.   DISCHARGE ACTIVITY: As tolerated.    FOLLOWUP INSTRUCTIONS: 1. Advised to hold amlodipine for now and restart in a couple of days if systolic blood pressure greater than 140. Follow up with PCP prior to restarting it.  2. PCP followup in 1 week.  3. ENT followup for vertigo in 1 week.  LABORATORY AND IMAGING STUDIES: Prior to discharge WBC 8.6, hemoglobin 11.1, hematocrit 35.1, platelet count 210,000. Sodium 141, potassium 4.1, chloride 108, bicarbonate 25, BUN 14, creatinine 0.94, glucose 98 and calcium of 8.3. TSH is 1.14. LDL cholesterol 78, HDL 53, total cholesterol 284152, triglycerides 104. HbA1c is 6.4.   BRIEF HOSPITAL COURSE: Gabriela Ramos is an obese, 59 year old female with history of vertigo worsening recently, hypertension, hyperlipidemia, who presents to the hospital secondary to dizziness, nausea and vomiting that gets worse with position or change of her posture.  1. Benign positional vertigo, known history. Was on meclizine as an outpatient but has not refilled her medication after the recent ER visit she had. She was admitted  under observation for symptom control, was on IV fluids, meclizine p.r.n., nausea medications. Her blood pressure is also on the lower side, which could trigger this condition. She was given IV fluids and the symptoms improved. She is being discharged home. She already has a followup referral to followup with [facility], Dr. Willeen CassBennett and has not made the appointment yet. She was strongly suggested to make the appointment and followup. She is being discharged on meclizine and Zofran p.r.n.  2. Metabolic syndrome with early diabetes mellitus, HbA1c of 6.4, diet-controlled, metformin once a day has been started.  3. Hypertension: Home medications of Coreg and losartan are continued. Amlodipine was asked to hold for now because her blood pressure is still on the lower side.  4. Her electrolytes were appropriately replaced.  5. Her course has been otherwise uneventful in the hospital.   DISCHARGE CONDITION: Stable.   DISCHARGE DISPOSITION: Home.   TIME SPENT ON DISCHARGE: 40 minutes.    ____________________________ Enid Baasadhika Janvi Ammar, MD rk:TT D: 01/21/2014 12:46:39 ET T: 01/21/2014 20:16:35 ET JOB#: [id number]  cc: Enid Baasadhika Cephus Tupy, MD, <Dictator> Enid BaasADHIKA Aailyah Dunbar MD ELECTRONICALLY SIGNED 02/06/2014 14:49

## 2014-08-09 ENCOUNTER — Ambulatory Visit: Payer: Self-pay | Admitting: Ophthalmology

## 2014-10-28 ENCOUNTER — Emergency Department
Admission: EM | Admit: 2014-10-28 | Discharge: 2014-10-28 | Disposition: A | Discharge status: Home / Self Care | Payer: No Typology Code available for payment source | Attending: Emergency Medicine | Admitting: Emergency Medicine

## 2014-10-28 ENCOUNTER — Encounter: Payer: Self-pay | Admitting: Emergency Medicine

## 2014-10-28 DIAGNOSIS — I10 Essential (primary) hypertension: Secondary | ICD-10-CM | POA: Diagnosis not present

## 2014-10-28 DIAGNOSIS — Z87891 Personal history of nicotine dependence: Secondary | ICD-10-CM | POA: Insufficient documentation

## 2014-10-28 DIAGNOSIS — R112 Nausea with vomiting, unspecified: Secondary | ICD-10-CM | POA: Diagnosis not present

## 2014-10-28 DIAGNOSIS — R42 Dizziness and giddiness: Secondary | ICD-10-CM | POA: Insufficient documentation

## 2014-10-28 DIAGNOSIS — Z79899 Other long term (current) drug therapy: Secondary | ICD-10-CM | POA: Diagnosis not present

## 2014-10-28 DIAGNOSIS — E119 Type 2 diabetes mellitus without complications: Secondary | ICD-10-CM | POA: Diagnosis not present

## 2014-10-28 DIAGNOSIS — Z7982 Long term (current) use of aspirin: Secondary | ICD-10-CM | POA: Diagnosis not present

## 2014-10-28 LAB — CBC WITH DIFFERENTIAL/PLATELET
BASOS PCT: 1 %
Basophils Absolute: 0.1 10*3/uL (ref 0–0.1)
EOS ABS: 0.3 10*3/uL (ref 0–0.7)
EOS PCT: 3 %
HCT: 38.3 % (ref 35.0–47.0)
HEMOGLOBIN: 12.7 g/dL (ref 12.0–16.0)
Lymphocytes Relative: 32 %
Lymphs Abs: 3 10*3/uL (ref 1.0–3.6)
MCH: 28.5 pg (ref 26.0–34.0)
MCHC: 33.2 g/dL (ref 32.0–36.0)
MCV: 85.9 fL (ref 80.0–100.0)
MONOS PCT: 7 %
Monocytes Absolute: 0.6 10*3/uL (ref 0.2–0.9)
NEUTROS PCT: 57 %
Neutro Abs: 5.4 10*3/uL (ref 1.4–6.5)
PLATELETS: 364 10*3/uL (ref 150–440)
RBC: 4.46 MIL/uL (ref 3.80–5.20)
RDW: 14.6 % — AB (ref 11.5–14.5)
WBC: 9.3 10*3/uL (ref 3.6–11.0)

## 2014-10-28 LAB — BASIC METABOLIC PANEL
Anion gap: 8 (ref 5–15)
BUN: 16 mg/dL (ref 6–20)
CO2: 29 mmol/L (ref 22–32)
CREATININE: 1 mg/dL (ref 0.44–1.00)
Calcium: 9.5 mg/dL (ref 8.9–10.3)
Chloride: 100 mmol/L — ABNORMAL LOW (ref 101–111)
GFR calc non Af Amer: >60 mL/min (ref >60)
Glucose, Bld: 130 mg/dL — ABNORMAL HIGH (ref 65–99)
Potassium: 3.5 mmol/L (ref 3.5–5.1)
SODIUM: 137 mmol/L (ref 135–145)

## 2014-10-28 MED ORDER — LORAZEPAM 2 MG/ML IJ SOLN
1.0000 mg | Freq: Once | INTRAMUSCULAR | Status: AC
  Administered 2014-10-28: 1 mg via INTRAVENOUS
  Filled 2014-10-28: qty 1

## 2014-10-28 MED ORDER — ONDANSETRON HCL 4 MG/2ML IJ SOLN
4.0000 mg | Freq: Once | INTRAMUSCULAR | Status: AC
  Administered 2014-10-28: 4 mg via INTRAVENOUS
  Filled 2014-10-28: qty 2

## 2014-10-28 NOTE — ED Notes (Signed)
Pt presents to the ER from home with complaints of dizziness and nausea, pt reports she has a history of Vertigo, reports she took her medication for Vertigo with no relieve. Pt talks in complete sentences denies any chest pain, or any other medical complaint.

## 2014-10-28 NOTE — Discharge Instructions (Signed)
Urine evaluated and treated for vertigo. We discussed follow-up with the primary care physician, and I have also provided the number for the neurologist office which she can call and make a next available appointment.  Return to the emergency department for any worsening condition including headache, confusion or altered mental status, weakness, numbness, worsening dizziness, or any other symptoms concerning to you.   Benign Positional Vertigo Vertigo means you feel like you or your surroundings are moving when they are not. Benign positional vertigo is the most common form of vertigo. Benign means that the cause of your condition is not serious. Benign positional vertigo is more common in older adults. CAUSES  Benign positional vertigo is the result of an upset in the labyrinth system. This is an area in the middle ear that helps control your balance. This may be caused by a viral infection, head injury, or repetitive motion. However, often no specific cause is found. SYMPTOMS  Symptoms of benign positional vertigo occur when you move your head or eyes in different directions. Some of the symptoms may include:  Loss of balance and falls.  Vomiting.  Blurred vision.  Dizziness.  Nausea.  Involuntary eye movements (nystagmus). DIAGNOSIS  Benign positional vertigo is usually diagnosed by physical exam. If the specific cause of your benign positional vertigo is unknown, your caregiver may perform imaging tests, such as magnetic resonance imaging (MRI) or computed tomography (CT). TREATMENT  Your caregiver may recommend movements or procedures to correct the benign positional vertigo. Medicines such as meclizine, benzodiazepines, and medicines for nausea may be used to treat your symptoms. In rare cases, if your symptoms are caused by certain conditions that affect the inner ear, you may need surgery. HOME CARE INSTRUCTIONS   Follow your caregiver's instructions.  Move slowly. Do not make  sudden body or head movements.  Avoid driving.  Avoid operating heavy machinery.  Avoid performing any tasks that would be dangerous to you or others during a vertigo episode.  Drink enough fluids to keep your urine clear or pale yellow. SEEK IMMEDIATE MEDICAL CARE IF:   You develop problems with walking, weakness, numbness, or using your arms, hands, or legs.  You have difficulty speaking.  You develop severe headaches.  Your nausea or vomiting continues or gets worse.  You develop visual changes.  Your family or friends notice any behavioral changes.  Your condition gets worse.  You have a fever.  You develop a stiff neck or sensitivity to light. MAKE SURE YOU:   Understand these instructions.  Will watch your condition.  Will get help right away if you are not doing well or get worse. Document Released: 10/27/2005 Document Revised: 04/13/2011 Document Reviewed: 10/09/2010 [facility] Patient Information 2015 [place], [place]. This information is not intended to replace advice given to you by your health care provider. Make sure you discuss any questions you have with your health care provider.

## 2014-10-28 NOTE — ED Provider Notes (Signed)
[facility] Emergency Department Provider Note   ____________________________________________  Time seen: 6:45 PM I have reviewed the triage vital signs and the triage nursing note.  HISTORY  Chief Complaint Dizziness and Nausea   Historian Patient and daughter  HPI Gabriela Ramos is a 59 y.o. 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 No headache. No fever. No recent upper respiratory congestion or sinus congestion. No confusion or altered mental status. Patient has had an episode bad enough to require emergency department care every several months per the patient and daughter for about 3 times. She states that she has followed up with primary care physician, ENT and was referred by ENT to see neurology, however she was unable to make an appointment due to insurance problems.  Symptoms of vertigo brought on by head movement and she has trouble walking due to the room spinning.  She does take meclizine at home for vertigo which normally helps, but it did not help today. Symptoms are moderate to severe. Patient states that at times she has had vertigo brought on by dehydration and low potassium.    Past Medical History  Diagnosis Date  . Vertigo   . Diabetes mellitus without complication   . Hypertension     There are no active problems to display for this patient.   Past Surgical History  Procedure Laterality Date  . Breast lumpectomy Left     "cyst"    Current Outpatient Rx  Name  Route  Sig  Dispense  Refill  . aspirin 325 MG tablet   Oral   Take 325 mg by mouth daily.         . carvedilol (COREG) 12.5 MG tablet   Oral   Take 12.5 mg by mouth every 12 (twelve) hours.      6   . losartan-hydrochlorothiazide (HYZAAR) 100-12.5 MG per tablet   Oral   Take 1 tablet by  mouth daily.      5   . meclizine (ANTIVERT) 25 MG tablet   Oral   Take 25 mg by mouth daily.          . potassium chloride SA (K-DUR,KLOR-CON) 20 MEQ tablet   Oral   Take 1 tablet (20 mEq total) by mouth daily.   7 tablet   0     Allergies Review of patient's allergies indicates no known allergies.  No family history on file.  Social History Social History  Substance Use Topics  . Smoking status: Former Games developer  . Smokeless tobacco: None  . Alcohol Use: No    Review of Systems  Constitutional: Negative for fever. Eyes: Negative for visual changes. ENT: Negative for sore throat. Cardiovascular: Negative for chest pain. Respiratory: Negative for shortness of breath. Gastrointestinal: Negative for abdominal pain or diarrhea. Genitourinary: Negative for dysuria. Musculoskeletal: Negative for back pain. Skin: Negative for rash. Neurological: Negative for headache. 10 point Review of Systems otherwise negative ____________________________________________   PHYSICAL EXAM:  VITAL SIGNS: ED Triage Vitals  Enc Vitals Group     BP 10/28/14 1751 168/88 mmHg     Pulse Rate 10/28/14 1751 75     Resp 10/28/14 1751 20     Temp 10/28/14 1751 98.3 F (36.8 C)     Temp Source 10/28/14 1751 Oral     SpO2 10/28/14 1751  98 %     Weight 10/28/14 1751 300 lb (136.079 kg)     Height 10/28/14 1751  (1.651 m)     Head Cir --      Peak Flow --      Pain Score 10/28/14 1753 0     Pain Loc --      Pain Edu? --      Excl. in GC? --      Constitutional: Alert and oriented. Well appearing and in no distress. Eyes: Conjunctivae are normal. PERRL. Normal extraocular movements. ENT   Head: Normocephalic and atraumatic.   Nose: No congestion/rhinnorhea.   Mouth/Throat: Mucous membranes are moist.   Neck: No stridor. Cardiovascular/Chest: Normal rate, regular rhythm.  No murmurs, rubs, or gallops. Respiratory: Normal respiratory effort without tachypnea nor  retractions. Breath sounds are clear and equal bilaterally. No wheezes/rales/rhonchi. Gastrointestinal: Soft. No distention, no guarding, no rebound. Nontender   Genitourinary/rectal:Deferred Musculoskeletal: Nontender with normal range of motion in all extremities. No joint effusions.  No lower extremity tenderness.  No edema. Neurologic:  Normal speech and language. No gross or focal neurologic deficits are appreciated. Skin:  Skin is warm, dry and intact. No rash noted. Psychiatric: Mood and affect are normal. Speech and behavior are normal. Patient exhibits appropriate insight and judgment.  ____________________________________________   EKG I, Governor Rooks, MD, the attending physician have personally viewed and interpreted all ECGs.  63 bpm. Normal sinus rhythm. Narrow QRS. Normal axis. Normal ST and T-wave. ____________________________________________  LABS (pertinent positives/negatives)  Basic metabolic panel without significant abnormality CBC shows white blood cell count of 9.3, hemoglobin 12.7 and platelet count 264  ____________________________________________  RADIOLOGY All Xrays were viewed by me. Imaging interpreted by Radiologist.  None __________________________________________  PROCEDURES  Procedure(s) performed: None  Critical Care performed: None  ____________________________________________   ED COURSE / ASSESSMENT AND PLAN  CONSULTATIONS: None  Pertinent labs & imaging results that were available during my care of the patient were reviewed by me and considered in my medical decision making (see chart for details).   Patient's symptoms seemed consistent with benign positional vertigo, rather than a central cause. She's not having a headache, altered mental status, and has no focal neurologic deficits on exam, as I do not feel a CT of the head is indicated this point in time. Patient does feel this is similar to all episodes of prior vertigo. She has  received improvement symptomatically from Ativan and Zofran in the past, and some to give her this. I'm going to check her BMP and CBC.  ----------------------------------------- 9:35 PM on 10/28/2014 -----------------------------------------  Patient rechecked and feels much better. For the ago symptoms are gone. I discussed with her I don't appreciate any high risk features to make me concerned about an alternate diagnosis other than benign positional vertigo. She'll be discharged home. We discussed return precautions.  Patient / Family / Caregiver informed of clinical course, medical decision-making process, and agree with plan.   I discussed return precautions, follow-up instructions, and discharged instructions with patient and/or family.  ___________________________________________   FINAL CLINICAL IMPRESSION(S) / ED DIAGNOSES   Final diagnoses:  Vertigo       Governor Rooks, MD 10/28/14 2136

## 2015-06-13 DIAGNOSIS — I10 Essential (primary) hypertension: Secondary | ICD-10-CM

## 2015-06-13 DIAGNOSIS — E119 Type 2 diabetes mellitus without complications: Secondary | ICD-10-CM

## 2015-08-01 ENCOUNTER — Emergency Department
Admission: EM | Admit: 2015-08-01 | Discharge: 2015-08-01 | Disposition: A | Discharge status: Home / Self Care | Payer: Self-pay | Attending: Emergency Medicine | Admitting: Emergency Medicine

## 2015-08-01 ENCOUNTER — Emergency Department: Payer: Self-pay

## 2015-08-01 ENCOUNTER — Encounter: Payer: Self-pay | Admitting: *Deleted

## 2015-08-01 DIAGNOSIS — I10 Essential (primary) hypertension: Secondary | ICD-10-CM | POA: Insufficient documentation

## 2015-08-01 DIAGNOSIS — Z7982 Long term (current) use of aspirin: Secondary | ICD-10-CM | POA: Insufficient documentation

## 2015-08-01 DIAGNOSIS — E119 Type 2 diabetes mellitus without complications: Secondary | ICD-10-CM | POA: Insufficient documentation

## 2015-08-01 DIAGNOSIS — Z87891 Personal history of nicotine dependence: Secondary | ICD-10-CM | POA: Insufficient documentation

## 2015-08-01 DIAGNOSIS — Z79899 Other long term (current) drug therapy: Secondary | ICD-10-CM | POA: Insufficient documentation

## 2015-08-01 DIAGNOSIS — J069 Acute upper respiratory infection, unspecified: Secondary | ICD-10-CM | POA: Insufficient documentation

## 2015-08-01 MED ORDER — GUAIFENESIN-CODEINE 100-10 MG/5ML PO SOLN: 10.0000 mL | ORAL | Status: AC | PRN

## 2015-08-01 MED ORDER — AZITHROMYCIN 250 MG PO TABS: ORAL_TABLET | ORAL | Status: DC

## 2015-08-01 MED ORDER — IBUPROFEN 800 MG PO TABS: 800.0000 mg | ORAL_TABLET | Freq: Three times a day (TID) | ORAL | Status: AC | PRN

## 2015-08-01 NOTE — ED Provider Notes (Signed)
[facility] Emergency Department Provider Note  ____________________________________________  Time seen: Approximately 3:09 PM  I have reviewed the triage vital signs and the nursing notes.   HISTORY  Chief Complaint Cough and Sore Throat    HPI Gabriela Ramos is a 60 y.o. female presents for evaluation of cough congestion and body aches 5 days. States her throat is sore from swallowing. It hurts to take a deep breath. Positive for fever and chills on and off. Has not taken any medication over-the-counter at this point.   Past Medical History  Diagnosis Date  . Vertigo   . Diabetes mellitus without complication (HCC)   . Hypertension     Patient Active Problem List   Diagnosis Date Noted  . Hypertension 11/30/2013  . Diabetes (HCC) 11/30/2013    Past Surgical History  Procedure Laterality Date  . Breast lumpectomy Left     "cyst"  . Tonsillectomy      Current Outpatient Rx  Name  Route  Sig  Dispense  Refill  . aspirin 325 MG tablet   Oral   Take 325 mg by mouth daily.         Marland Kitchen. azithromycin (ZITHROMAX Z-PAK) 250 MG tablet      Take 2 tablets (500 mg) on  Day 1,  followed by 1 tablet (250 mg) once daily on Days 2 through 5.   6 each   0   . carvedilol (COREG) 12.5 MG tablet   Oral   Take 12.5 mg by mouth every 12 (twelve) hours.      6   . guaiFENesin-codeine 100-10 MG/5ML syrup   Oral   Take 10 mLs by mouth every 4 (four) hours as needed for cough.   180 mL   0   . ibuprofen (ADVIL,MOTRIN) 800 MG tablet   Oral   Take 1 tablet (800 mg total) by mouth every 8 (eight) hours as needed.   30 tablet   0   . losartan-hydrochlorothiazide (HYZAAR) 100-12.5 MG per tablet   Oral   Take 1 tablet by mouth daily.      5   . meclizine (ANTIVERT) 25 MG tablet   Oral   Take 25 mg by mouth daily.          . potassium chloride SA (K-DUR,KLOR-CON) 20 MEQ tablet   Oral   Take 1 tablet (20 mEq total) by mouth daily.   7  tablet   0     Allergies Review of patient's allergies indicates no known allergies.  Family History  Problem Relation Age of Onset  . Heart attack Mother 8575  . Diabetes Mother   . Hypertension Mother   . Heart attack Father     2050's  . Diabetes Sister   . Hypertension Sister   . Heart attack Brother     Social History Social History  Substance Use Topics  . Smoking status: Former Games developermoker  . Smokeless tobacco: None  . Alcohol Use: No    Review of Systems Constitutional: Positive fever/chills Eyes: No visual changes. ENT: Positive sore throat. Cardiovascular: Denies chest pain. Respiratory: Positive shortness of breath with coughing. Musculoskeletal: Negative for back pain. Skin: Negative for rash. Neurological: Negative for headaches, focal weakness or numbness.  10-point ROS otherwise negative.  ____________________________________________   PHYSICAL EXAM: BP 166/88 mmHg  Pulse 67  Temp(Src) 98.9 F (37.2 C) (Oral)  Resp 16  Ht 5\' 3"  (1.6 m)  SpO2 98%  VITAL SIGNS: ED Triage  Vitals  Enc Vitals Group     BP --      Pulse --      Resp --      Temp --      Temp src --      SpO2 --      Weight --      Height --      Head Cir --      Peak Flow --      Pain Score --      Pain Loc --      Pain Edu? --      Excl. in GC? --     Constitutional: Alert and oriented. Well appearing and in no acute distress. Eyes: Conjunctivae are normal. PERRL. EOMI. Head: Atraumatic. Nose: No congestion/rhinnorhea. Mouth/Throat: Mucous membranes are moist.  Oropharynx non-erythematous. Neck: No stridor.   Cardiovascular: Normal rate, regular rhythm. Grossly normal heart sounds.  Good peripheral circulation. Respiratory: Normal respiratory effort.  No retractions. Lungs CTAB. Gastrointestinal: Soft and nontender. No distention. No abdominal bruits. No CVA tenderness. Musculoskeletal: No lower extremity tenderness nor edema.  No joint effusions. Neurologic:  Normal  speech and language. No gross focal neurologic deficits are appreciated. No gait instability. Skin:  Skin is warm, dry and intact. No rash noted. Psychiatric: Mood and affect are normal. Speech and behavior are normal.  ____________________________________________   LABS (all labs ordered are listed, but only abnormal results are displayed)  Labs Reviewed - No data to display ____________________________________________  EKG   ____________________________________________  RADIOLOGY  IMPRESSION: 1. Increased interstitial markings and cardiomegaly. The findings could represent pulmonary venous congestion versus atypical infection given history. Recommend follow-up to resolution. 2. Increased prominence of the right hilum could represent vascular engorgement or adenopathy. Recommend attention on follow-up. ____________________________________________   PROCEDURES  Procedure(s) performed: None  Critical Care performed: No  ____________________________________________   INITIAL IMPRESSION / ASSESSMENT AND PLAN / ED COURSE  Pertinent labs & imaging results that were available during my care of the patient were reviewed by me and considered in my medical decision making (see chart for details).  Acute upper respiratory infection with bronchitis. Increased interstitial markings. Rx given for Zithromax, Robitussin-AC and Motrin for body aches. Patient follow-up and repeat chest x-ray in 3 weeks. Work excuse 48 hours given.  ____________________________________________   FINAL CLINICAL IMPRESSION(S) / ED DIAGNOSES  Final diagnoses:  URI, acute     This chart was dictated using voice recognition software/Dragon. Despite best efforts to proofread, errors can occur which can change the meaning. Any change was purely unintentional.   Evangeline Dakinharles M Dalayna Lauter, PA-C 08/01/15 1601  Phineas SemenGraydon Goodman, MD 08/01/15 [phone]

## 2015-08-01 NOTE — ED Notes (Signed)
Pt reports sore throat and cough since Sunday with body aches

## 2015-08-01 NOTE — Discharge Instructions (Signed)
Upper Respiratory Infection, Adult Most upper respiratory infections (URIs) are a viral infection of the air passages leading to the lungs. A URI affects the nose, throat, and upper air passages. The most common type of URI is nasopharyngitis and is typically referred to as "the common cold." URIs run their course and usually go away on their own. Most of the time, a URI does not require medical attention, but sometimes a bacterial infection in the upper airways can follow a viral infection. This is called a secondary infection. Sinus and middle ear infections are common types of secondary upper respiratory infections. Bacterial pneumonia can also complicate a URI. A URI can worsen asthma and chronic obstructive pulmonary disease (COPD). Sometimes, these complications can require emergency medical care and may be life threatening.  CAUSES Almost all URIs are caused by viruses. A virus is a type of germ and can spread from one person to another.  RISKS FACTORS You may be at risk for a URI if:   You smoke.   You have chronic heart or lung disease.  You have a weakened defense (immune) system.   You are very young or very old.   You have nasal allergies or asthma.  You work in crowded or poorly ventilated areas.  You work in health care facilities or schools. SIGNS AND SYMPTOMS  Symptoms typically develop 2-3 days after you come in contact with a cold virus. Most viral URIs last 7-10 days. However, viral URIs from the influenza virus (flu virus) can last 14-18 days and are typically more severe. Symptoms may include:   Runny or stuffy (congested) nose.   Sneezing.   Cough.   Sore throat.   Headache.   Fatigue.   Fever.   Loss of appetite.   Pain in your forehead, behind your eyes, and over your cheekbones (sinus pain).  Muscle aches.  DIAGNOSIS  Your health care provider may diagnose a URI by:  Physical exam.  Tests to check that your symptoms are not due to  another condition such as:  Strep throat.  Sinusitis.  Pneumonia.  Asthma. TREATMENT  A URI goes away on its own with time. It cannot be cured with medicines, but medicines may be prescribed or recommended to relieve symptoms. Medicines may help:  Reduce your fever.  Reduce your cough.  Relieve nasal congestion. HOME CARE INSTRUCTIONS   Take medicines only as directed by your health care provider.   Gargle warm saltwater or take cough drops to comfort your throat as directed by your health care provider.  Use a warm mist humidifier or inhale steam from a shower to increase air moisture. This may make it easier to breathe.  Drink enough fluid to keep your urine clear or pale yellow.   Eat soups and other clear broths and maintain good nutrition.   Rest as needed.   Return to work when your temperature has returned to normal or as your health care provider advises. You may need to stay home longer to avoid infecting others. You can also use a face mask and careful hand washing to prevent spread of the virus.  Increase the usage of your inhaler if you have asthma.   Do not use any tobacco products, including cigarettes, chewing tobacco, or electronic cigarettes. If you need help quitting, ask your health care provider. PREVENTION  The best way to protect yourself from getting a cold is to practice good hygiene.   Avoid oral or hand contact with people with cold  symptoms.   Wash your hands often if contact occurs.  There is no clear evidence that vitamin C, vitamin E, echinacea, or exercise reduces the chance of developing a cold. However, it is always recommended to get plenty of rest, exercise, and practice good nutrition.  SEEK MEDICAL CARE IF:   You are getting worse rather than better.   Your symptoms are not controlled by medicine.   You have chills.  You have worsening shortness of breath.  You have brown or red mucus.  You have yellow or brown nasal  discharge.  You have pain in your face, especially when you bend forward.  You have a fever.  You have swollen neck glands.  You have pain while swallowing.  You have white areas in the back of your throat. SEEK IMMEDIATE MEDICAL CARE IF:   You have severe or persistent:  Headache.  Ear pain.  Sinus pain.  Chest pain.  You have chronic lung disease and any of the following:  Wheezing.  Prolonged cough.  Coughing up blood.  A change in your usual mucus.  You have a stiff neck.  You have changes in your:  Vision.  Hearing.  Thinking.  Mood. MAKE SURE YOU:    Understand these instructions.  Will watch your condition.  Will get help right away if you are not doing well or get worse.  Return in 2 weeks for repeat chest x-ray either with your primary care provider or here to the ER.   This information is not intended to replace advice given to you by your health care provider. Make sure you discuss any questions you have with your health care provider.   Document Released: 07/15/2000 Document Revised: 06/05/2014 Document Reviewed: 04/26/2013 Elsevier Interactive Patient Education [employer]2016 [employer]

## 2015-08-01 NOTE — ED Notes (Signed)
Strep Test Negative, EDP notified.

## 2015-08-01 NOTE — ED Notes (Signed)
Pt discharged to home.  Discharge instructions reviewed.  Verbalized understanding.  No questions or concerns at this time.  Teach back verified.  Pt in NAD.  No items left in ED.   

## 2016-02-14 ENCOUNTER — Encounter: Payer: Self-pay | Admitting: Emergency Medicine

## 2016-02-14 ENCOUNTER — Emergency Department
Admission: EM | Admit: 2016-02-14 | Discharge: 2016-02-14 | Disposition: A | Discharge status: Home / Self Care | Payer: BLUE CROSS/BLUE SHIELD | Attending: Emergency Medicine | Admitting: Emergency Medicine

## 2016-02-14 DIAGNOSIS — Z7982 Long term (current) use of aspirin: Secondary | ICD-10-CM | POA: Diagnosis not present

## 2016-02-14 DIAGNOSIS — E119 Type 2 diabetes mellitus without complications: Secondary | ICD-10-CM | POA: Insufficient documentation

## 2016-02-14 DIAGNOSIS — Z791 Long term (current) use of non-steroidal anti-inflammatories (NSAID): Secondary | ICD-10-CM | POA: Diagnosis not present

## 2016-02-14 DIAGNOSIS — J069 Acute upper respiratory infection, unspecified: Secondary | ICD-10-CM | POA: Diagnosis not present

## 2016-02-14 DIAGNOSIS — I10 Essential (primary) hypertension: Secondary | ICD-10-CM | POA: Diagnosis not present

## 2016-02-14 DIAGNOSIS — Z79899 Other long term (current) drug therapy: Secondary | ICD-10-CM | POA: Diagnosis not present

## 2016-02-14 DIAGNOSIS — R05 Cough: Secondary | ICD-10-CM | POA: Diagnosis present

## 2016-02-14 MED ORDER — AZITHROMYCIN 250 MG PO TABS: ORAL_TABLET | ORAL | 0 refills | Status: AC

## 2016-02-14 MED ORDER — BENZONATATE 100 MG PO CAPS: 100.0000 mg | ORAL_CAPSULE | Freq: Three times a day (TID) | ORAL | 0 refills | Status: AC | PRN

## 2016-02-14 NOTE — ED Provider Notes (Signed)
[facility] Emergency Department Provider Note  ____________________________________________  Time seen: Approximately 5:53 PM  I have reviewed the triage vital signs and the nursing notes.   HISTORY  Chief Complaint Fever and Cough    HPI Gabriela Ramos is a 61 y.o. female presents to the emergency department with 4 days of productive cough, congestion, fever, chills. Patient states that she is coughing up yellow sputum. Patient has been taking over-the-counter cold medication for symptoms, which is not helping. No sick contacts. Patient denies difficulty breathing, chest pain, nausea, vomiting, abdominal pain.   Past Medical History:  Diagnosis Date  . Diabetes mellitus without complication (HCC)   . Hypertension   . Vertigo     Patient Active Problem List   Diagnosis Date Noted  . Hypertension 11/30/2013  . Diabetes (HCC) 11/30/2013    Past Surgical History:  Procedure Laterality Date  . BREAST LUMPECTOMY Left    "cyst"  . TONSILLECTOMY      Prior to Admission medications   Medication Sig Start Date End Date Taking? Authorizing Provider  aspirin 325 MG tablet Take 325 mg by mouth daily.    Historical Provider, MD  azithromycin (ZITHROMAX Z-PAK) 250 MG tablet Take 2 tablets (500 mg) on  Day 1,  followed by 1 tablet (250 mg) once daily on Days 2 through 5. 02/14/16   Enid Derry, PA-C  benzonatate (TESSALON PERLES) 100 MG capsule Take 1 capsule (100 mg total) by mouth 3 (three) times daily as needed for cough. 02/14/16 02/13/17  Enid Derry, PA-C  carvedilol (COREG) 12.5 MG tablet Take 12.5 mg by mouth every 12 (twelve) hours. 10/06/14   Historical Provider, MD  guaiFENesin-codeine 100-10 MG/5ML syrup Take 10 mLs by mouth every 4 (four) hours as needed for cough. 08/01/15   Charmayne Sheer Beers, PA-C  ibuprofen (ADVIL,MOTRIN) 800 MG tablet Take 1 tablet (800 mg total) by mouth every 8 (eight) hours as needed. 08/01/15   Charmayne Sheer Beers, PA-C   losartan-hydrochlorothiazide (HYZAAR) 100-12.5 MG per tablet Take 1 tablet by mouth daily. 10/06/14   Historical Provider, MD  meclizine (ANTIVERT) 25 MG tablet Take 25 mg by mouth daily.     Historical Provider, MD  potassium chloride SA (K-DUR,KLOR-CON) 20 MEQ tablet Take 1 tablet (20 mEq total) by mouth daily. 09/15/13   Doug Sou, MD    Allergies Patient has no known allergies.  Family History  Problem Relation Age of Onset  . Heart attack Father     57's  . Heart attack Mother 81  . Diabetes Mother   . Hypertension Mother   . Diabetes Sister   . Hypertension Sister   . Heart attack Brother     Social History Social History  Substance Use Topics  . Smoking status: Former Games developer  . Smokeless tobacco: Never Used  . Alcohol use No     Review of Systems   Eyes: No visual changes. No discharge. ENT: Positive for congestion and rhinorrhea. Cardiovascular: No chest pain. Respiratory: Positive for cough. No SOB. Gastrointestinal: No abdominal pain.  No nausea, no vomiting.  No diarrhea.  No constipation. Musculoskeletal: Negative for musculoskeletal pain. Skin: Negative for rash, abrasions, lacerations, ecchymosis. Neurological: Negative for headaches.   ____________________________________________   PHYSICAL EXAM:  VITAL SIGNS: ED Triage Vitals  Enc Vitals Group     BP 02/14/16 1459 (!) 145/75     Pulse Rate 02/14/16 1459 70     Resp 02/14/16 1459 18     Temp  02/14/16 1459 99.9 F (37.7 C)     Temp Source 02/14/16 1459 Oral     SpO2 02/14/16 1459 93 %     Weight 02/14/16 1459 260 lb (117.9 kg)     Height 02/14/16 1459 5\' 3"  (1.6 m)     Head Circumference --      Peak Flow --      Pain Score 02/14/16 1500 5     Pain Loc --      Pain Edu? --      Excl. in GC? --      Constitutional: Alert and oriented. Well appearing and in no acute distress. Eyes: Conjunctivae are normal. PERRL. EOMI. No discharge. Head: Atraumatic. ENT: No frontal and maxillary  sinus tenderness.      Ears: Tympanic membranes pearly gray with good landmarks. No discharge.      Nose: No congestion/rhinnorhea.      Mouth/Throat: Mucous membranes are moist. Oropharynx non-erythematous. Tonsils not enlarged. No exudates. Uvula midline. Neck: No stridor.   Hematological/Lymphatic/Immunilogical: No cervical lymphadenopathy. Cardiovascular: Normal rate, regular rhythm. Normal S1 and S2.  Good peripheral circulation. Respiratory: Normal respiratory effort without tachypnea or retractions. Lungs CTAB. Good air entry to the bases with no decreased or absent breath sounds. Gastrointestinal: Bowel sounds 4 quadrants. Soft and nontender to palpation. No guarding or rigidity. No palpable masses. No distention. Musculoskeletal: Full range of motion to all extremities. No gross deformities appreciated. Neurologic:  Normal speech and language. No gross focal neurologic deficits are appreciated.  Skin:  Skin is warm, dry and intact. No rash noted. Psychiatric: Mood and affect are normal. Speech and behavior are normal. Patient exhibits appropriate insight and judgement.   ____________________________________________   LABS (all labs ordered are listed, but only abnormal results are displayed)  Labs Reviewed - No data to display ____________________________________________  EKG   ____________________________________________  RADIOLOGY   No results found.  ____________________________________________    PROCEDURES  Procedure(s) performed:    Procedures    Medications - No data to display   ____________________________________________   INITIAL IMPRESSION / ASSESSMENT AND PLAN / ED COURSE  Pertinent labs & imaging results that were available during my care of the patient were reviewed by me and considered in my medical decision making (see chart for details).  Review of the [place] CSRS was performed in accordance of the [facility] prior to dispensing any controlled  drugs.  Clinical Course     Patient's diagnosis is consistent with Upper respiratory infection. Exam and vital signs are reassuring. Patient will be discharged home with prescriptions for Tessalon Perles and azithromycin. Patient is to follow up with PCP as needed or otherwise directed. Patient is given ED precautions to return to the ED for any worsening or new symptoms.     ____________________________________________  FINAL CLINICAL IMPRESSION(S) / ED DIAGNOSES  Final diagnoses:  Upper respiratory tract infection, unspecified type      NEW MEDICATIONS STARTED DURING THIS VISIT:  Discharge Medication List as of 02/14/2016  3:49 PM    START taking these medications   Details  benzonatate (TESSALON PERLES) 100 MG capsule Take 1 capsule (100 mg total) by mouth 3 (three) times daily as needed for cough., Starting Fri 02/14/2016, Until Sat 02/13/2017, Print            This chart was dictated using voice recognition software/Dragon. Despite best efforts to proofread, errors can occur which can change the meaning. Any change was purely unintentional.    Enid DerryAshley Jaydyn Bozzo, PA-C  02/14/16 1756    Minna Antis, MD 02/14/16 2236

## 2016-02-14 NOTE — ED Triage Notes (Signed)
Pt to ed with c/o cough, congestion and fever over the last few days.

## 2018-01-19 ENCOUNTER — Other Ambulatory Visit: Payer: Self-pay | Admitting: Physician Assistant

## 2018-01-19 ENCOUNTER — Other Ambulatory Visit: Payer: Self-pay | Admitting: Internal Medicine

## 2018-01-19 DIAGNOSIS — Z1231 Encounter for screening mammogram for malignant neoplasm of breast: Secondary | ICD-10-CM

## 2019-07-14 ENCOUNTER — Other Ambulatory Visit: Payer: Self-pay | Admitting: Physician Assistant

## 2019-07-14 DIAGNOSIS — Z1231 Encounter for screening mammogram for malignant neoplasm of breast: Secondary | ICD-10-CM

## 2021-07-01 ENCOUNTER — Emergency Department: Payer: BLUE CROSS/BLUE SHIELD

## 2021-07-01 ENCOUNTER — Other Ambulatory Visit: Payer: Self-pay

## 2021-07-01 ENCOUNTER — Emergency Department
Admission: EM | Admit: 2021-07-01 | Discharge: 2021-07-01 | Disposition: A | Discharge status: Home / Self Care | Payer: BLUE CROSS/BLUE SHIELD | Attending: Student in an Organized Health Care Education/Training Program | Admitting: Student in an Organized Health Care Education/Training Program

## 2021-07-01 DIAGNOSIS — R5381 Other malaise: Secondary | ICD-10-CM | POA: Insufficient documentation

## 2021-07-01 DIAGNOSIS — R42 Dizziness and giddiness: Secondary | ICD-10-CM | POA: Insufficient documentation

## 2021-07-01 DIAGNOSIS — I10 Essential (primary) hypertension: Secondary | ICD-10-CM | POA: Insufficient documentation

## 2021-07-01 DIAGNOSIS — H9313 Tinnitus, bilateral: Secondary | ICD-10-CM | POA: Insufficient documentation

## 2021-07-01 HISTORY — DX: Essential (primary) hypertension: I10

## 2021-07-01 HISTORY — DX: Dizziness and giddiness: R42

## 2021-07-01 LAB — URINALYSIS, ROUTINE W REFLEX MICROSCOPIC
Bilirubin Urine: NEGATIVE
Glucose, UA: NEGATIVE mg/dL
Hgb urine dipstick: NEGATIVE
Ketones, ur: NEGATIVE mg/dL
Nitrite: NEGATIVE
Protein, ur: NEGATIVE mg/dL
Specific Gravity, Urine: 1.008 (ref 1.005–1.030)
pH: 7 (ref 5.0–8.0)

## 2021-07-01 LAB — BASIC METABOLIC PANEL
Anion gap: 10 (ref 5–15)
BUN: 21 mg/dL (ref 8–23)
CO2: 29 mmol/L (ref 22–32)
Calcium: 10.2 mg/dL (ref 8.9–10.3)
Chloride: 98 mmol/L (ref 98–111)
Creatinine, Ser: 0.98 mg/dL (ref 0.44–1.00)
GFR, Estimated: >60 mL/min (ref >60)
Glucose, Bld: 140 mg/dL — ABNORMAL HIGH (ref 70–99)
Potassium: 3 mmol/L — ABNORMAL LOW (ref 3.5–5.1)
Sodium: 137 mmol/L (ref 135–145)

## 2021-07-01 LAB — CBC
HCT: 40.8 % (ref 36.0–46.0)
Hemoglobin: 12.7 g/dL (ref 12.0–15.0)
MCH: 27.8 pg (ref 26.0–34.0)
MCHC: 31.1 g/dL (ref 30.0–36.0)
MCV: 89.3 fL (ref 80.0–100.0)
Platelets: 390 10*3/uL (ref 150–400)
RBC: 4.57 MIL/uL (ref 3.87–5.11)
RDW: 13.8 % (ref 11.5–15.5)
WBC: 10.6 10*3/uL — ABNORMAL HIGH (ref 4.0–10.5)
nRBC: 0 % (ref 0.0–0.2)

## 2021-07-01 LAB — TROPONIN I (HIGH SENSITIVITY): Troponin I (High Sensitivity): 6 ng/L (ref <18)

## 2021-07-01 MED ORDER — POTASSIUM CHLORIDE CRYS ER 20 MEQ PO TBCR: 20.0000 meq | EXTENDED_RELEASE_TABLET | Freq: Every day | ORAL | 0 refills | Status: AC

## 2021-07-01 MED ORDER — POTASSIUM CHLORIDE CRYS ER 20 MEQ PO TBCR
40.0000 meq | EXTENDED_RELEASE_TABLET | Freq: Once | ORAL | Status: AC
  Administered 2021-07-01: 40 meq via ORAL
  Filled 2021-07-01: qty 2

## 2021-07-01 NOTE — ED Notes (Signed)
Patient verbalized discharge understanding  

## 2021-07-01 NOTE — ED Triage Notes (Signed)
Pt come with c/o dizziness that started weeks ago. Pt states the spells come and go. Pt denies any N/V/D.  Pt states the spells are throughout the day .

## 2021-07-01 NOTE — ED Provider Notes (Signed)
[facility] Provider Note    Event Date/Time   First MD Initiated Contact with Patient 07/01/21 1546     (approximate)   History   Dizziness   HPI  Bronte Sabado is a 66 y.o. female with hypertension as well as vertigo presents to the ER for intermittent episodes of dizziness spinning generalized malaise.  Feels different from her previous episodes of vertigo and meclizine is not working.  Symptoms started several weeks ago.  States she is also having some ringing in her ears.  Denies any numbness or tingling no pain at this time.  No chest pain or pressure.     Physical Exam   Triage Vital Signs: ED Triage Vitals  Enc Vitals Group     BP 07/01/21 1328 (!) 182/97     Pulse Rate 07/01/21 1323 98     Resp 07/01/21 1323 18     Temp 07/01/21 1323 98.7 F (37.1 C)     Temp Source 07/01/21 1323 Oral     SpO2 07/01/21 1323 98 %     Weight --      Height --      Head Circumference --      Peak Flow --      Pain Score 07/01/21 1326 0     Pain Loc --      Pain Edu? --      Excl. in GC? --     Most recent vital signs: Vitals:   07/01/21 1647 07/01/21 1920  BP: (!) 175/85 (!) 170/85  Pulse: 96 87  Resp: 18   Temp: 98.5 F (36.9 C)   SpO2: 96% 97%     Constitutional: Alert  Eyes: Conjunctivae are normal.  Head: Atraumatic. Nose: No congestion/rhinnorhea. Mouth/Throat: Mucous membranes are moist.   Neck: Painless ROM.  Cardiovascular:   Good peripheral circulation. No m/g/r Respiratory: Normal respiratory effort.  No retractions.  Gastrointestinal: Soft and nontender.  Musculoskeletal:  no deformity Neurologic:  MAE spontaneously. No gross focal neurologic deficits are appreciated.  Skin:  Skin is warm, dry and intact. No rash noted. Psychiatric: Mood and affect are normal. Speech and behavior are normal.    ED Results / Procedures / Treatments   Labs (all labs ordered are listed, but only abnormal results are displayed) Labs  Reviewed  BASIC METABOLIC PANEL - Abnormal; Notable for the following components:      Result Value   Potassium 3.0 (*)    Glucose, Bld 140 (*)    All other components within normal limits  CBC - Abnormal; Notable for the following components:   WBC 10.6 (*)    All other components within normal limits  URINALYSIS, ROUTINE W REFLEX MICROSCOPIC - Abnormal; Notable for the following components:   Color, Urine STRAW (*)    APPearance CLEAR (*)    Leukocytes,Ua TRACE (*)    Bacteria, UA RARE (*)    All other components within normal limits  CBG MONITORING, ED  TROPONIN I (HIGH SENSITIVITY)  TROPONIN I (HIGH SENSITIVITY)     EKG  ED ECG REPORT I, Willy Eddy, the attending physician, personally viewed and interpreted this ECG.   Date: 07/01/2021  EKG Time: 13:20  Rate: 95  Rhythm: sinus  Axis: normal  Intervals:  normal  ST&T Change: no stemi, no depressions    RADIOLOGY Please see ED Course for my review and interpretation.  I personally reviewed all radiographic images ordered to evaluate for the above acute complaints and  reviewed radiology reports and findings.  These findings were personally discussed with the patient.  Please see medical record for radiology report.    PROCEDURES:  Critical Care performed: No  Procedures   MEDICATIONS ORDERED IN ED: Medications  potassium chloride SA (KLOR-CON M) CR tablet 40 mEq (40 mEq Oral Given 07/01/21 1610)     IMPRESSION / MDM / ASSESSMENT AND PLAN / ED COURSE  I reviewed the triage vital signs and the nursing notes.                              Differential diagnosis includes, but is not limited to, electrolyte abnormality, dehydration, orthostasis, BPPV, CVA, mass,  Patient presenting to the ER for evaluation of intermittent episodes of dizziness as described above.  She is nontoxic-appearing with nonfocal neuro exam given her history of hypertension age and risk factors however This presenting complaint  could reflect a potentially life-threatening illness therefore the patient will be placed on continuous pulse oximetry and telemetry for monitoring.  Laboratory evaluation will be sent to evaluate for the above complaints.  Neuroimaging ordered for the above differential.  I have a lower suspicion for cardiac etiology.    Clinical Course as of 07/01/21 2133  Tue Jul 01, 2021  2130 MRI on my interpretation without evidence of large mass or bleed.  Per radiology report without evidence of acute stroke.  She remains neuro intact and asymptomatic at this time.  Reassuring lab work up.  Does appear stable and appropriate for outpatient follow-up. [PR]    Clinical Course User Index [PR] Willy Eddy, MD     FINAL CLINICAL IMPRESSION(S) / ED DIAGNOSES   Final diagnoses:  Dizziness     Rx / DC Orders   ED Discharge Orders          Ordered    potassium chloride SA (KLOR-CON M) 20 MEQ tablet  Daily        07/01/21 2133             Note:  This document was prepared using Dragon voice recognition software and may include unintentional dictation errors.    Willy Eddy, MD 07/01/21 2134

## 2021-10-16 ENCOUNTER — Other Ambulatory Visit: Payer: Self-pay | Admitting: Family Medicine

## 2021-10-16 DIAGNOSIS — Z1231 Encounter for screening mammogram for malignant neoplasm of breast: Secondary | ICD-10-CM

## 2022-03-25 ENCOUNTER — Encounter: Payer: Self-pay | Admitting: Emergency Medicine

## 2022-04-13 ENCOUNTER — Ambulatory Visit
Admission: RE | Admit: 2022-04-13 | Discharge: 2022-04-13 | Disposition: A | Discharge status: Home / Self Care | Payer: 59 | Source: Ambulatory Visit | Attending: Family Medicine | Admitting: Family Medicine

## 2022-04-13 DIAGNOSIS — Z1231 Encounter for screening mammogram for malignant neoplasm of breast: Secondary | ICD-10-CM | POA: Diagnosis not present

## 2022-04-15 ENCOUNTER — Encounter: Payer: Self-pay | Admitting: Family Medicine

## 2022-04-17 ENCOUNTER — Other Ambulatory Visit: Payer: Self-pay | Admitting: Family Medicine

## 2022-04-17 DIAGNOSIS — R928 Other abnormal and inconclusive findings on diagnostic imaging of breast: Secondary | ICD-10-CM

## 2022-04-17 DIAGNOSIS — R921 Mammographic calcification found on diagnostic imaging of breast: Secondary | ICD-10-CM

## 2022-04-21 ENCOUNTER — Other Ambulatory Visit: Payer: 59

## 2022-08-04 ENCOUNTER — Ambulatory Visit
Admission: RE | Admit: 2022-08-04 | Discharge: 2022-08-04 | Disposition: A | Discharge status: Home / Self Care | Payer: 59 | Source: Ambulatory Visit | Attending: Family Medicine | Admitting: Family Medicine

## 2022-08-04 DIAGNOSIS — R928 Other abnormal and inconclusive findings on diagnostic imaging of breast: Secondary | ICD-10-CM | POA: Insufficient documentation

## 2022-08-04 DIAGNOSIS — R921 Mammographic calcification found on diagnostic imaging of breast: Secondary | ICD-10-CM | POA: Diagnosis present

## 2022-11-18 ENCOUNTER — Other Ambulatory Visit: Payer: Self-pay | Admitting: Student

## 2022-11-18 DIAGNOSIS — R42 Dizziness and giddiness: Secondary | ICD-10-CM

## 2022-11-26 ENCOUNTER — Ambulatory Visit
Admission: RE | Admit: 2022-11-26 | Discharge: 2022-11-26 | Disposition: A | Discharge status: Home / Self Care | Payer: 59 | Source: Ambulatory Visit | Attending: Student | Admitting: Student

## 2022-11-26 DIAGNOSIS — R42 Dizziness and giddiness: Secondary | ICD-10-CM | POA: Insufficient documentation

## 2022-12-30 ENCOUNTER — Other Ambulatory Visit: Payer: Self-pay | Admitting: Family Medicine

## 2023-01-06 ENCOUNTER — Other Ambulatory Visit: Payer: Self-pay | Admitting: Family Medicine

## 2023-01-06 DIAGNOSIS — R921 Mammographic calcification found on diagnostic imaging of breast: Secondary | ICD-10-CM

## 2023-09-15 ENCOUNTER — Other Ambulatory Visit: Payer: Self-pay | Admitting: Family Medicine

## 2023-09-15 DIAGNOSIS — Z780 Asymptomatic menopausal state: Secondary | ICD-10-CM

## 2023-11-29 ENCOUNTER — Other Ambulatory Visit: Payer: Self-pay | Admitting: Family Medicine

## 2023-11-29 DIAGNOSIS — R928 Other abnormal and inconclusive findings on diagnostic imaging of breast: Secondary | ICD-10-CM

## 2023-12-14 ENCOUNTER — Ambulatory Visit
Admission: RE | Admit: 2023-12-14 | Discharge: 2023-12-14 | Disposition: A | Discharge status: Home / Self Care | Source: Ambulatory Visit | Attending: Family Medicine | Admitting: Family Medicine

## 2023-12-14 DIAGNOSIS — R928 Other abnormal and inconclusive findings on diagnostic imaging of breast: Secondary | ICD-10-CM | POA: Insufficient documentation

## 2023-12-14 DIAGNOSIS — Z780 Asymptomatic menopausal state: Secondary | ICD-10-CM | POA: Diagnosis present

## 2023-12-21 ENCOUNTER — Other Ambulatory Visit: Payer: Self-pay | Admitting: Physician Assistant

## 2023-12-21 DIAGNOSIS — M25561 Pain in right knee: Secondary | ICD-10-CM

## 2024-01-10 ENCOUNTER — Ambulatory Visit

## 2024-01-17 ENCOUNTER — Ambulatory Visit: Admission: RE | Admit: 2024-01-17 | Discharge: 2024-01-17 | Attending: Physician Assistant

## 2024-01-17 DIAGNOSIS — M25561 Pain in right knee: Secondary | ICD-10-CM

## 2024-02-04 IMAGING — MR MR HEAD W/O CM
12 series · 42 of 48 positions shown · non-contrast
Comparison: Prior CT from 03/23/2012.

CLINICAL DATA: Initial evaluation for acute dizziness.

EXAM:
MRI HEAD WITHOUT CONTRAST
TECHNIQUE: Multiplanar, multiecho pulse sequences of the brain and surrounding
structures were obtained without intravenous contrast.

[Series 13: ax dwi_tracew · axial · 3.0mm · 0.65mm/px · z∈[-146,+8]mm · 3 of 47 slices shown]
[im 1/47]
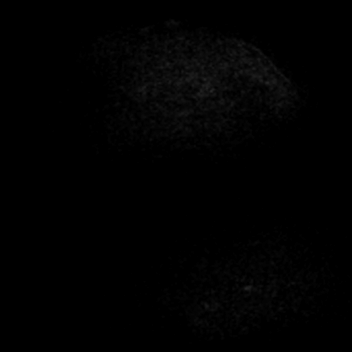
[im 24/47]
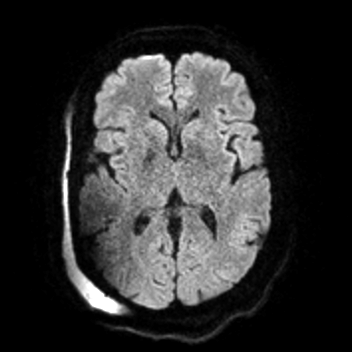
[im 47/47]
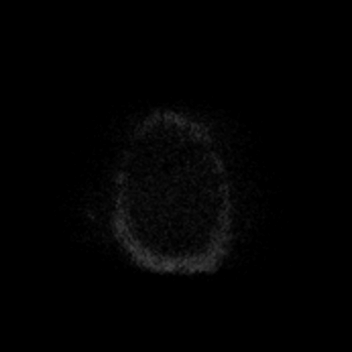

[Series 14: ax dwi_adc · axial · 3.0mm · 0.65mm/px · z∈[-146,+8]mm · 4 of 48 slices shown]
[im 1/48]
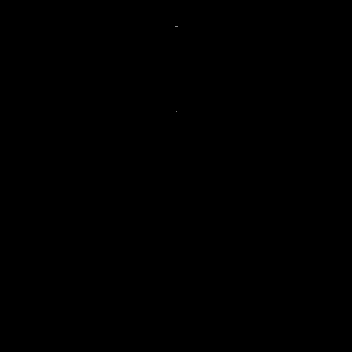
[im 16/48]
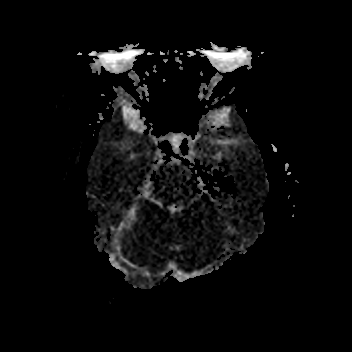
[im 32/48]
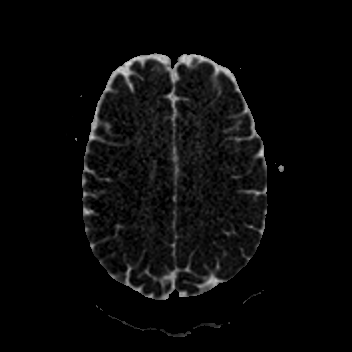
[im 48/48]
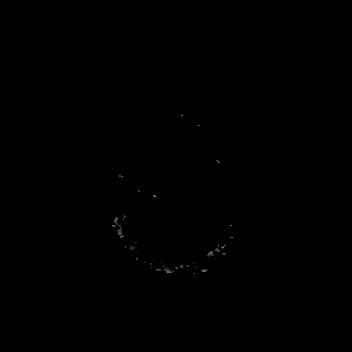

[Series 15: cor dwi_tracew · coronal · 5.0mm · 0.65mm/px · 3 of 40 slices shown]
[im 1/40]
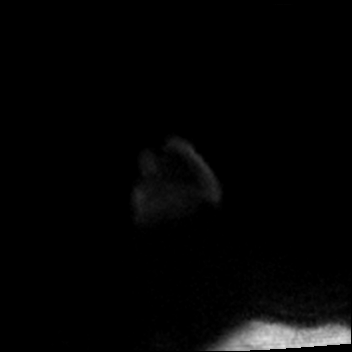
[im 20/40]
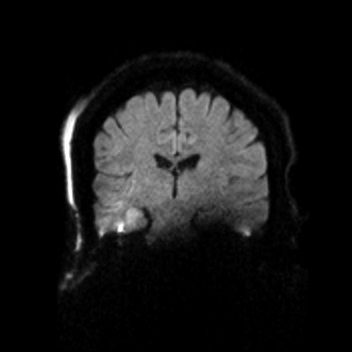
[im 40/40]
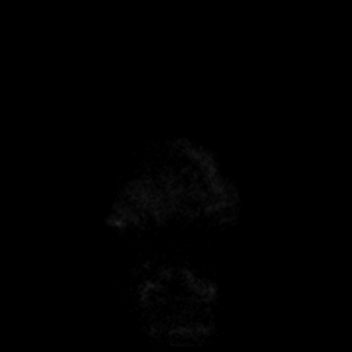

[Series 16: cor dwi_adc · coronal · 5.0mm · 0.65mm/px · 3 of 40 slices shown]
[im 1/40]
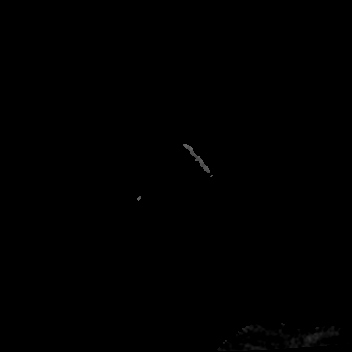
[im 20/40]
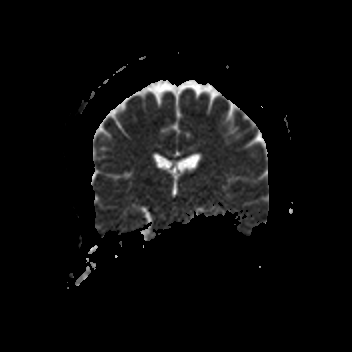
[im 40/40]
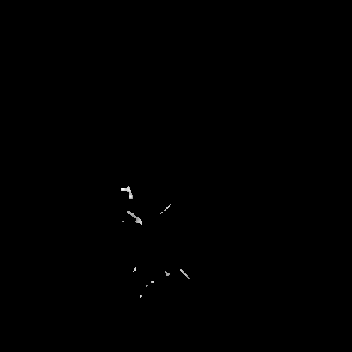

[Series 17: T1 · sagittal · 5.0mm · 0.62mm/px · 2 of 25 slices shown (1 of 2)]
[im 1/25]
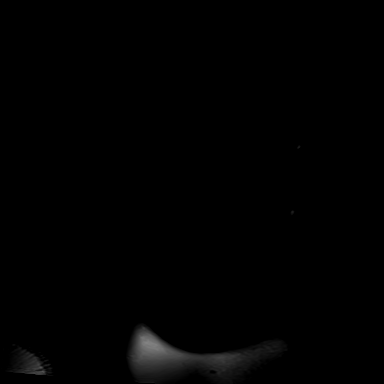
[im 25/25]
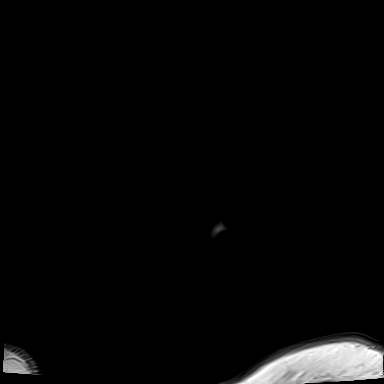

[Series 18: T2 · axial · 5.0mm · 0.53mm/px · z∈[-146,+9]mm · 2 of 27 slices shown (1 of 2)]
[im 1/27]
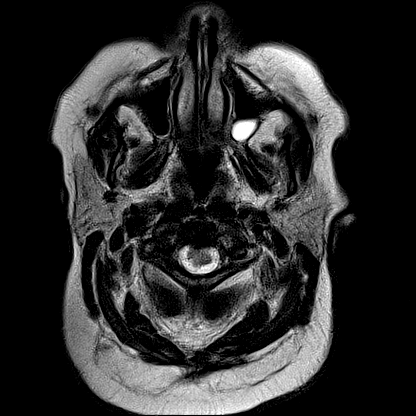
[im 27/27]
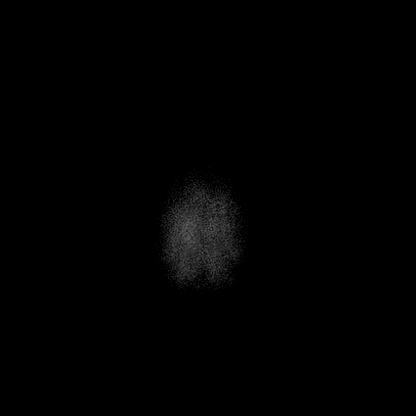

[Series 19: T2 · coronal · 5.0mm · 0.45mm/px · 3 of 33 slices shown (2 of 2)]
[im 1/33]
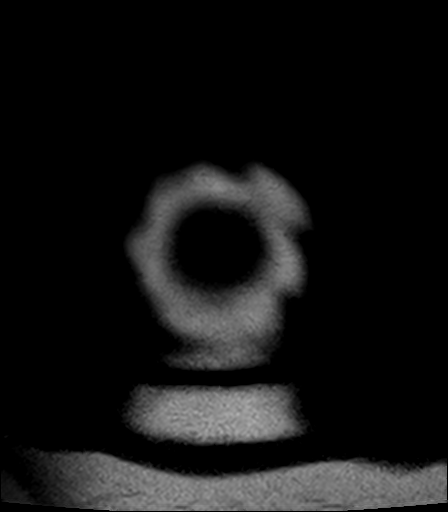
[im 17/33]
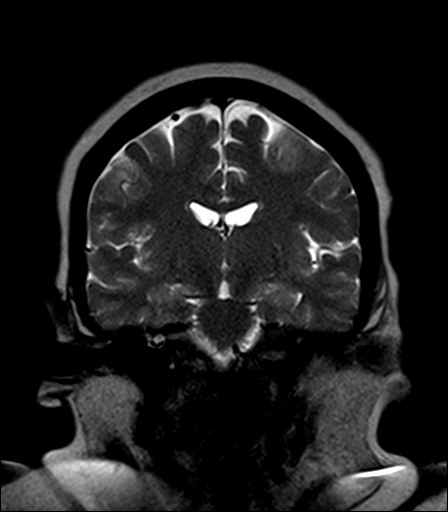
[im 33/33]
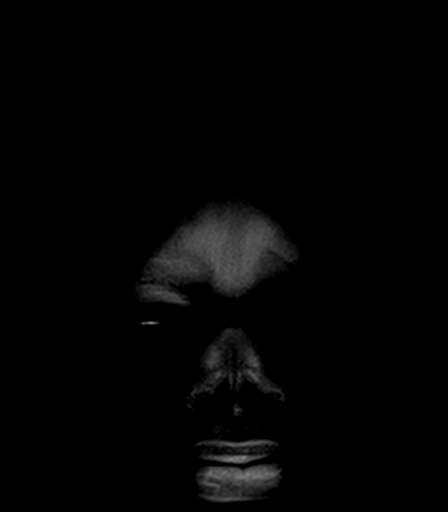

[Series 21: pha_images · axial · 3.0mm · 0.90mm/px · z∈[-146,+6]mm · 4 of 50 slices shown]
[im 1/50]
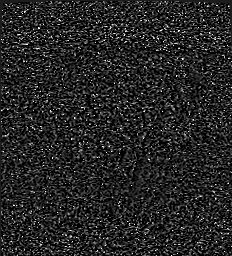
[im 17/50]
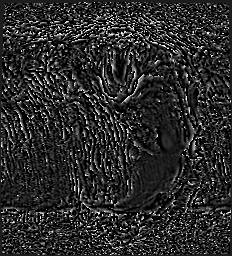
[im 33/50]
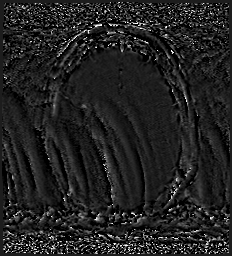
[im 50/50]
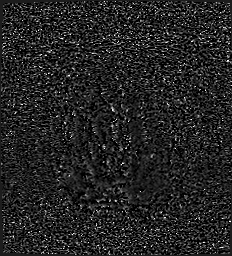

[Series 22: swi_images · axial · 3.0mm · 0.90mm/px · z∈[-146,+6]mm · 4 of 52 slices shown]
[im 1/52]
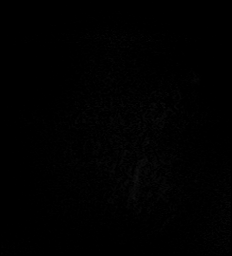
[im 18/52]
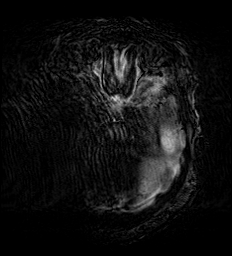
[im 35/52]
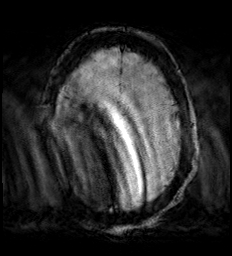
[im 52/52]
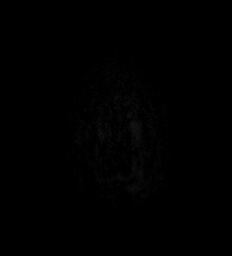

[Series 24: FLAIR · axial · 3.0mm · 0.69mm/px · z∈[-151,+10]mm · 4 of 55 slices shown]
[im 1/55]
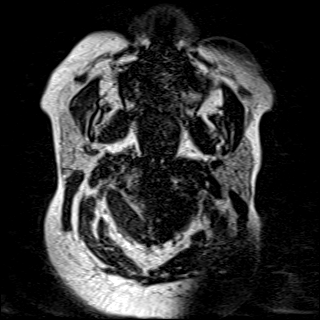
[im 19/55]
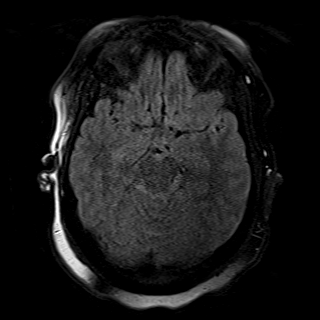
[im 37/55]
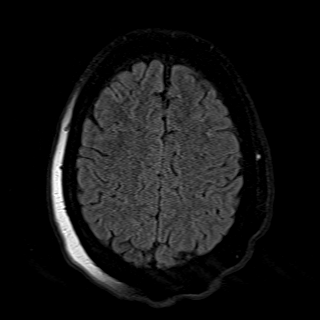
[im 55/55]
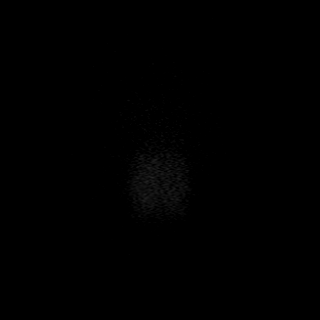

[Series 25: T1 · axial · 1.0mm · 0.98mm/px · z∈[-154,+20]mm · 8 of 176 slices shown (2 of 2)]
[im 1/176]
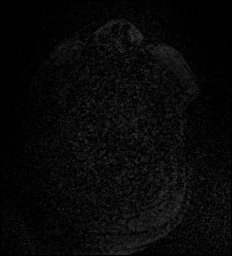
[im 27/176]
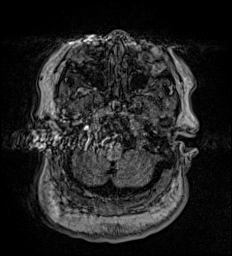
[im 54/176]
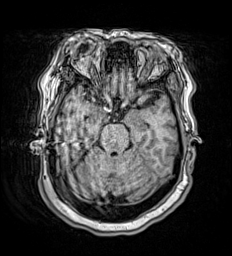
[im 81/176]
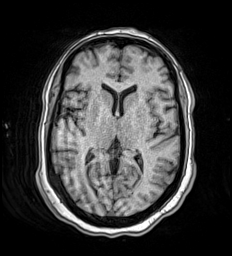
[im 95/176]
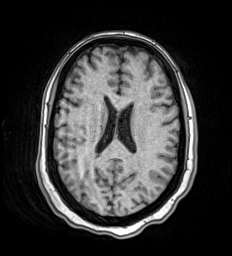
[im 122/176]
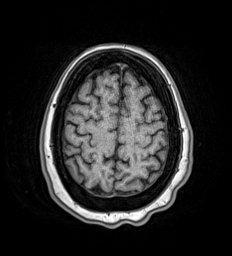
[im 149/176]
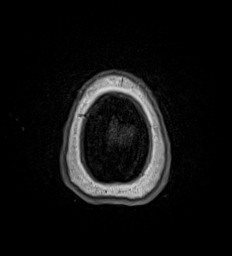
[im 176/176]
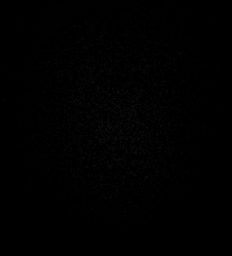

[Series 26: T2-star · axial · 5.0mm · 0.45mm/px · z∈[-156,-0]mm · 2 of 27 slices shown]
[im 1/27]
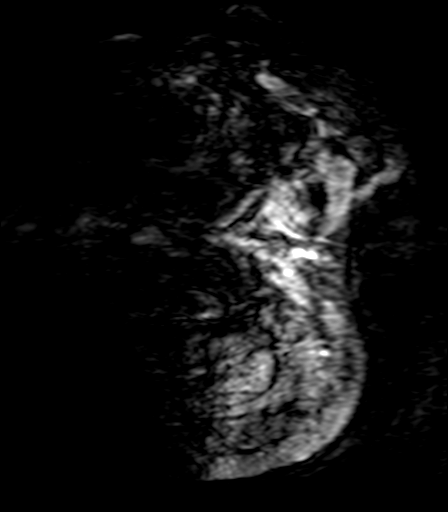
[im 27/27]
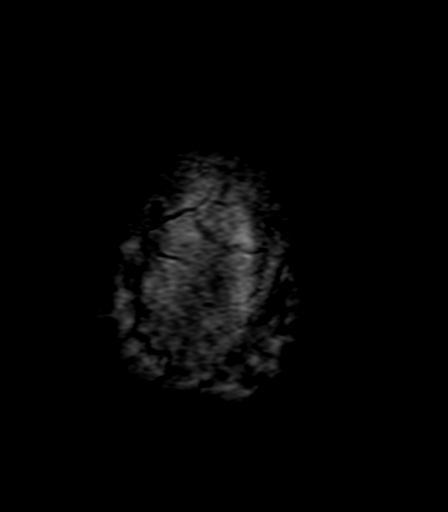

[42 of 48 positions shown; findings below may reference images not displayed]

FINDINGS: Brain: Examination technically limited by extensive susceptibility
artifact emanating from the scalp.

Cerebral volume within normal limits. No significant cerebral white
matter disease for age.

No visible foci of restricted diffusion to suggest acute or subacute
ischemia. Gray-white matter differentiation maintained. No visible
areas of chronic cortical infarction. No visible acute or chronic
intracranial blood products.

No mass lesion, midline shift or mass effect. No hydrocephalus or
extra-axial fluid collection. Pituitary gland suprasellar region
normal. Midline structures intact and normally formed.

Vascular: Major intracranial vascular flow voids are maintained.

Skull and upper cervical spine: Craniocervical junction within
normal limits. Bone marrow signal intensity diffusely decreased on
T1 weighted sequence, nonspecific, but most commonly related to
anemia, smoking or obesity. No scalp soft tissue abnormality.

Sinuses/Orbits: Globes and orbital soft tissues within normal
limits. Small left maxillary sinus retention cyst. Paranasal sinuses
are otherwise clear. No significant mastoid effusion.

Other: None.
IMPRESSION: 1. Somewhat limited exam due to extensive susceptibility artifact
emanating from the scalp.
2. Grossly normal brain MRI for age. No acute intracranial
abnormality identified.
